# Patient Record
Sex: Male | Born: 1950 | Race: White | Hispanic: No | Marital: Married | Smoking: Former smoker
Health system: Southern US, Community
[De-identification: ages and names within clinical notes are randomized; demographics above are authoritative.]

## PROBLEM LIST (undated history)

## (undated) DIAGNOSIS — E119 Type 2 diabetes mellitus without complications: Secondary | ICD-10-CM

## (undated) DIAGNOSIS — M1990 Unspecified osteoarthritis, unspecified site: Secondary | ICD-10-CM

## (undated) DIAGNOSIS — I10 Essential (primary) hypertension: Secondary | ICD-10-CM

## (undated) DIAGNOSIS — N400 Enlarged prostate without lower urinary tract symptoms: Secondary | ICD-10-CM

## (undated) DIAGNOSIS — N189 Chronic kidney disease, unspecified: Secondary | ICD-10-CM

## (undated) DIAGNOSIS — I6529 Occlusion and stenosis of unspecified carotid artery: Secondary | ICD-10-CM

## (undated) HISTORY — PX: CHOLECYSTECTOMY: SHX55

## (undated) HISTORY — PX: PROSTATE SURGERY: SHX751

## (undated) HISTORY — PX: EYE SURGERY: SHX253

## (undated) HISTORY — PX: FRACTURE SURGERY: SHX138

---

## 2012-04-26 DIAGNOSIS — H35053 Retinal neovascularization, unspecified, bilateral: Secondary | ICD-10-CM

## 2012-04-26 DIAGNOSIS — H30013 Focal chorioretinal inflammation, juxtapapillary, bilateral: Secondary | ICD-10-CM

## 2012-04-26 HISTORY — DX: Focal chorioretinal inflammation, juxtapapillary, bilateral: H30.013

## 2012-04-26 HISTORY — DX: Retinal neovascularization, unspecified, bilateral: H35.053

## 2014-03-16 DIAGNOSIS — M1612 Unilateral primary osteoarthritis, left hip: Secondary | ICD-10-CM

## 2014-03-16 DIAGNOSIS — I10 Essential (primary) hypertension: Secondary | ICD-10-CM

## 2014-03-16 DIAGNOSIS — E785 Hyperlipidemia, unspecified: Secondary | ICD-10-CM

## 2014-03-16 HISTORY — DX: Hyperlipidemia, unspecified: E78.5

## 2014-03-16 HISTORY — DX: Essential (primary) hypertension: I10

## 2014-03-16 HISTORY — DX: Unilateral primary osteoarthritis, left hip: M16.12

## 2015-05-20 DIAGNOSIS — H2511 Age-related nuclear cataract, right eye: Secondary | ICD-10-CM

## 2015-05-20 HISTORY — DX: Age-related nuclear cataract, right eye: H25.11

## 2015-06-25 DIAGNOSIS — E7800 Pure hypercholesterolemia, unspecified: Secondary | ICD-10-CM

## 2015-06-25 HISTORY — DX: Pure hypercholesterolemia, unspecified: E78.00

## 2017-09-27 DIAGNOSIS — M19012 Primary osteoarthritis, left shoulder: Secondary | ICD-10-CM | POA: Insufficient documentation

## 2017-09-27 DIAGNOSIS — M7542 Impingement syndrome of left shoulder: Secondary | ICD-10-CM

## 2017-09-27 HISTORY — DX: Primary osteoarthritis, left shoulder: M19.012

## 2017-09-27 HISTORY — DX: Impingement syndrome of left shoulder: M75.42

## 2019-06-16 DIAGNOSIS — M48061 Spinal stenosis, lumbar region without neurogenic claudication: Secondary | ICD-10-CM

## 2019-06-16 HISTORY — DX: Spinal stenosis, lumbar region without neurogenic claudication: M48.061

## 2019-07-24 DIAGNOSIS — T7840XA Allergy, unspecified, initial encounter: Secondary | ICD-10-CM

## 2019-07-24 HISTORY — DX: Allergy, unspecified, initial encounter: T78.40XA

## 2019-11-06 ENCOUNTER — Other Ambulatory Visit: Payer: Self-pay | Admitting: Neurological Surgery

## 2020-01-09 NOTE — Pre-Procedure Instructions (Signed)
[id number] - [place], [place] - [address] [place] [address] Phone: [phone] Fax: [phone]     Your procedure is scheduled on Wednesday November 10th.  Report to [facility] Main Entrance "A" at 8:00 A.M., and check in at the Admitting office.  Call this number if you have problems the morning of surgery:  [phone]  Call [phone] if you have any questions prior to your surgery date Monday-Friday 8am-4pm    Remember:  Do not eat or drink after midnight the night before your surgery      Take these medicines the morning of surgery with A SIP OF WATER  amLODipine (NORVASC)  fenofibrate micronized (LOFIBRA)   HOW TO MANAGE YOUR DIABETES BEFORE AND AFTER SURGERY  Why is it important to control my blood sugar before and after surgery?  Improving blood sugar levels before and after surgery helps healing and can limit problems.  A way of improving blood sugar control is eating a healthy diet by: o  Eating less sugar and carbohydrates o  Increasing activity/exercise o  Talking with your doctor about reaching your blood sugar goals  High blood sugars (greater than 180 mg/dL) can raise your risk of infections and slow your recovery, so you will need to focus on controlling your diabetes during the weeks before surgery.  Make sure that the doctor who takes care of your diabetes knows about your planned surgery including the date and location.  How do I manage my blood sugar before surgery?  Check your blood sugar at least 4 times a day, starting 2 days before surgery, to make sure that the level is not too high or low. o Check your blood sugar the morning of your surgery when you wake up and every 2 hours until you get to the Short Stay unit.  If your blood sugar is less than 70 mg/dL, you will need to treat for low blood sugar: o Do not take insulin. o Treat a low blood sugar (less than 70 mg/dL) with  cup of clear  juice (cranberry or apple), 4 glucose tablets, OR glucose gel. Recheck blood sugar in 15 minutes after treatment (to make sure it is greater than 70 mg/dL). If your blood sugar is not greater than 70 mg/dL on recheck, call [phone] o  for further instructions.  Report your blood sugar to the short stay nurse when you get to Short Stay.   If you are admitted to the hospital after surgery: o Your blood sugar will be checked by the staff and you will probably be given insulin after surgery (instead of oral diabetes medicines) to make sure you have good blood sugar levels. o The goal for blood sugar control after surgery is 80-180 mg/dL.     WHAT DO I DO ABOUT MY DIABETES MEDICATION?    Do not take oral diabetes medicines (pills): glipiZIDE (GLUCOTROL) the morning of surgery.   THE NIGHT BEFORE SURGERY,do NOT take insulin aspart (NOVOLOG)       THE MORNING OF SURGERY, take 30 units of LEVEMIR FLEXTOUCH insulin.    The day of surgery, do not take other diabetes injectables, including Byetta (exenatide), Bydureon (exenatide ER), Victoza (liraglutide), or Trulicity (dulaglutide).   As of today, STOP taking any Aspirin (unless otherwise instructed by your surgeon) Aleve, Naproxen, Ibuprofen, Motrin, Advil, Goody's, BC's, all herbal medications, fish oil, and all vitamins.  Do not wear jewelry, make up, or nail polish            Do not wear lotions, powders, perfumes/colognes, or deodorant.            Do not shave 48 hours prior to surgery.  Men may shave face and neck.            Do not bring valuables to the hospital.            [facility] is not responsible for any belongings or valuables.  Do NOT Smoke (Tobacco/Vaping) or drink Alcohol 24 hours prior to your procedure If you use a CPAP at night, you may bring all equipment for your overnight stay.   Contacts, glasses, dentures or bridgework may not be worn into surgery.      For patients admitted to the  hospital, discharge time will be determined by your treatment team.   Patients discharged the day of surgery will not be allowed to drive home, and someone needs to stay with them for 24 hours.    Special instructions:   [place]- Preparing For Surgery  Before surgery, you can play an important role. Because skin is not sterile, your skin needs to be as free of germs as possible. You can reduce the number of germs on your skin by washing with CHG (chlorahexidine gluconate) Soap before surgery.  CHG is an antiseptic cleaner which kills germs and bonds with the skin to continue killing germs even after washing.    Oral Hygiene is also important to reduce your risk of infection.  Remember - BRUSH YOUR TEETH THE MORNING OF SURGERY WITH YOUR REGULAR TOOTHPASTE  Please do not use if you have an allergy to CHG or antibacterial soaps. If your skin becomes reddened/irritated stop using the CHG.  Do not shave (including legs and underarms) for at least 48 hours prior to first CHG shower. It is OK to shave your face.  Please follow these instructions carefully.   1. Shower the NIGHT BEFORE SURGERY and the MORNING OF SURGERY with CHG Soap.   2. If you chose to wash your hair, wash your hair first as usual with your normal shampoo.  3. After you shampoo, rinse your hair and body thoroughly to remove the shampoo.  4. Use CHG as you would any other liquid soap. You can apply CHG directly to the skin and wash gently with a scrungie or a clean washcloth.   5. Apply the CHG Soap to your body ONLY FROM THE NECK DOWN.  Do not use on open wounds or open sores. Avoid contact with your eyes, ears, mouth and genitals (private parts). Wash Face and genitals (private parts)  with your normal soap.   6. Wash thoroughly, paying special attention to the area where your surgery will be performed.  7. Thoroughly rinse your body with warm water from the neck down.  8. DO NOT shower/wash with your normal soap  after using and rinsing off the CHG Soap.  9. Pat yourself dry with a CLEAN TOWEL.  10. Wear CLEAN PAJAMAS to bed the night before surgery  11. Place CLEAN SHEETS on your bed the night of your first shower and DO NOT SLEEP WITH PETS.   Day of Surgery: Wear Clean/Comfortable clothing the morning of surgery Do not apply any deodorants/lotions.   Remember to brush your teeth WITH YOUR REGULAR TOOTHPASTE.   Please read over the following fact sheets that you were given.

## 2020-01-12 ENCOUNTER — Other Ambulatory Visit: Payer: Self-pay

## 2020-01-12 ENCOUNTER — Encounter (HOSPITAL_COMMUNITY): Payer: Self-pay

## 2020-01-12 ENCOUNTER — Encounter (HOSPITAL_COMMUNITY)
Admission: RE | Admit: 2020-01-12 | Discharge: 2020-01-12 | Disposition: A | Discharge status: Home / Self Care | Payer: Medicare Other | Source: Ambulatory Visit | Attending: Neurological Surgery | Admitting: Neurological Surgery

## 2020-01-12 ENCOUNTER — Other Ambulatory Visit (HOSPITAL_COMMUNITY)
Admission: RE | Admit: 2020-01-12 | Discharge: 2020-01-12 | Disposition: A | Discharge status: Home / Self Care | Payer: Medicare Other | Source: Ambulatory Visit | Attending: Neurological Surgery | Admitting: Neurological Surgery

## 2020-01-12 ENCOUNTER — Ambulatory Visit (HOSPITAL_COMMUNITY)
Admission: RE | Admit: 2020-01-12 | Discharge: 2020-01-12 | Disposition: A | Discharge status: Home / Self Care | Payer: Medicare Other | Source: Ambulatory Visit | Attending: Neurological Surgery | Admitting: Neurological Surgery

## 2020-01-12 DIAGNOSIS — Z20822 Contact with and (suspected) exposure to covid-19: Secondary | ICD-10-CM | POA: Insufficient documentation

## 2020-01-12 DIAGNOSIS — M48061 Spinal stenosis, lumbar region without neurogenic claudication: Secondary | ICD-10-CM

## 2020-01-12 DIAGNOSIS — Z01818 Encounter for other preprocedural examination: Secondary | ICD-10-CM | POA: Diagnosis present

## 2020-01-12 HISTORY — DX: Essential (primary) hypertension: I10

## 2020-01-12 HISTORY — DX: Unspecified osteoarthritis, unspecified site: M19.90

## 2020-01-12 HISTORY — DX: Benign prostatic hyperplasia without lower urinary tract symptoms: N40.0

## 2020-01-12 HISTORY — DX: Type 2 diabetes mellitus without complications: E11.9

## 2020-01-12 HISTORY — DX: Occlusion and stenosis of unspecified carotid artery: I65.29

## 2020-01-12 HISTORY — DX: Chronic kidney disease, unspecified: N18.9

## 2020-01-12 LAB — PROTIME-INR
INR: 1 (ref 0.8–1.2)
Prothrombin Time: 12.9 seconds (ref 11.4–15.2)

## 2020-01-12 LAB — CBC WITH DIFFERENTIAL/PLATELET
Abs Immature Granulocytes: 0.03 10*3/uL (ref 0.00–0.07)
Basophils Absolute: 0.1 10*3/uL (ref 0.0–0.1)
Basophils Relative: 1 %
Eosinophils Absolute: 0.2 10*3/uL (ref 0.0–0.5)
Eosinophils Relative: 2 %
HCT: 45.4 % (ref 39.0–52.0)
Hemoglobin: 14.2 g/dL (ref 13.0–17.0)
Immature Granulocytes: 0 %
Lymphocytes Relative: 18 %
Lymphs Abs: 1.6 10*3/uL (ref 0.7–4.0)
MCH: 27.7 pg (ref 26.0–34.0)
MCHC: 31.3 g/dL (ref 30.0–36.0)
MCV: 88.7 fL (ref 80.0–100.0)
Monocytes Absolute: 0.6 10*3/uL (ref 0.1–1.0)
Monocytes Relative: 7 %
Neutro Abs: 6.2 10*3/uL (ref 1.7–7.7)
Neutrophils Relative %: 72 %
Platelets: 349 10*3/uL (ref 150–400)
RBC: 5.12 MIL/uL (ref 4.22–5.81)
RDW: 14.8 % (ref 11.5–15.5)
WBC: 8.7 10*3/uL (ref 4.0–10.5)
nRBC: 0 % (ref 0.0–0.2)

## 2020-01-12 LAB — BASIC METABOLIC PANEL
Anion gap: 11 (ref 5–15)
BUN: 16 mg/dL (ref 8–23)
CO2: 22 mmol/L (ref 22–32)
Calcium: 9.2 mg/dL (ref 8.9–10.3)
Chloride: 105 mmol/L (ref 98–111)
Creatinine, Ser: 0.96 mg/dL (ref 0.61–1.24)
GFR, Estimated: >60 mL/min (ref >60)
Glucose, Bld: 127 mg/dL — ABNORMAL HIGH (ref 70–99)
Potassium: 4 mmol/L (ref 3.5–5.1)
Sodium: 138 mmol/L (ref 135–145)

## 2020-01-12 LAB — GLUCOSE, CAPILLARY: Glucose-Capillary: 140 mg/dL — ABNORMAL HIGH (ref 70–99)

## 2020-01-12 LAB — HEMOGLOBIN A1C
Hgb A1c MFr Bld: 8.3 % — ABNORMAL HIGH (ref 4.8–5.6)
Mean Plasma Glucose: 191.51 mg/dL

## 2020-01-12 LAB — SARS CORONAVIRUS 2 (TAT 6-24 HRS): SARS Coronavirus 2: NEGATIVE

## 2020-01-12 LAB — SURGICAL PCR SCREEN
MRSA, PCR: NEGATIVE
Staphylococcus aureus: POSITIVE — AB

## 2020-01-12 NOTE — Progress Notes (Signed)
PCP - Doreen Salvage, PA-C Cardiologist - denies   Chest x-ray - 01/12/20 EKG - recent EKG at PCP- request Stress Test - "in past"- records requested ECHO - denies Cardiac Cath - denies   Sleep Study - denies   Fasting Blood Sugar - 140's Checks Blood Sugar daily  Aspirin Instructions: Patient instructed to hold all Aspirin, NSAID's, herbal medications, fish oil and vitamins 7 days prior to surgery.   COVID TEST- 01/12/20 at at [address] [facility]. Pt instructed to remain in their car. Educated on Haematologist until [employer]    Anesthesia review: yes- records requested  Patient denies shortness of breath, fever, cough and chest pain at PAT appointment   All instructions explained to the patient, with a verbal understanding of the material. Patient agrees to go over the instructions while at home for a better understanding. Patient also instructed to self quarantine after being tested for COVID-19. The opportunity to ask questions was provided.

## 2020-01-13 ENCOUNTER — Encounter (HOSPITAL_COMMUNITY): Payer: Self-pay

## 2020-01-13 NOTE — Progress Notes (Signed)
Anesthesia Chart Review:  Case: [id number] Date/Time: 01/14/20 0945   Procedure: Laminectomy - L3-L4 - L4-L5 - left (Left Back) - 3C   Anesthesia type: General   Pre-op diagnosis: Stenosis   Location: [facility] ROOM 18 / [facility] OR   Surgeons: Tia Alert, MD      DISCUSSION: Patient is a 69 year old [name] scheduled for the above procedure.  History includes former smoker, HTN, DM2, left renal cysts & hepatic steatosis (noted on 04/10/19 abd [place] @ [facility]), BPH (s/p TURP 03/13/18), carotid stenosis (RICA 60-79% 04/2019), + COVID-19 test 10/17/19. BMI is consistent with morbid obesity.  Evaluation by his PCP Doreen Salvage, PA-C on 10/Ronald/21. EKG done as part of presurgical evaluation and was felt stable. He advised improved surgical outcomes with A1c < 7.2%, so patient followed up with his endocrinologist Dr. Katrinka Blazing. A1c 8.5% on 12/23/19 which had dropped from 9.4% since 11/25/19. Dr. Katrinka Blazing wrote (Care Everywhere), "His A1C has continued improve - it is actually better than 8.5%- it has improved on e point in one month.Marland KitchenMarland KitchenHis A1C continues to improve. He can proeed with surgery."  01/12/2020 presurgical COVID-19 test negative.  Anesthesia team to evaluate on the day of surgery.   VS: BP (!) (P) 142/59   Pulse (P) 78   Resp (P) 18   Ht (P) 5\' 4"  (1.626 m)   Wt (P) 105.8 kg   SpO2 (P) 97%   BMI (P) 40.04 kg/m    PROVIDERS: , PA-C is PCP. Last visit 10/Ronald/21 and aware of surgery plans. He notes KEG stable, and patient without chest pain or SOB. He advised that ideally A1c < 7.2% would improve results. Normal ABI on 04/10/19 and follow-up carotid 06/08/19 showed 60-79% RICA [place]. [facility]) - [facility] Katrinka Blazing, MD is endocrinologist [facility] Care Everywhere)   LABS: Labs reviewed: Acceptable for surgery. A1c 8.3% (down from 8.5% 12/23/19 and 9.4% 11/25/19, [facility]) (all labs ordered are listed, but only abnormal results are displayed)  Labs Reviewed  SURGICAL PCR  SCREEN - Abnormal; Notable for the following components:      Result Value   Staphylococcus aureus POSITIVE (*)    All other components within normal limits  GLUCOSE, CAPILLARY - Abnormal; Notable for the following components:   Glucose-Capillary 140 (*)    All other components within normal limits  HEMOGLOBIN A1C - Abnormal; Notable for the following components:   Hgb A1c MFr Bld 8.3 (*)    All other components within normal limits  BASIC METABOLIC PANEL - Abnormal; Notable for the following components:   Glucose, Bld 127 (*)    All other components within normal limits  CBC WITH DIFFERENTIAL/PLATELET  PROTIME-INR     IMAGES: CXR 01/12/20: FINDINGS: Frontal and lateral views of the chest demonstrate an unremarkable cardiac silhouette. No airspace disease, effusion, or pneumothorax. Diffuse thoracic spondylosis. IMPRESSION: 1. No acute intrathoracic process.   EKG: 10/Ronald/21 [facility]): Sinus Rhythm. Non-specific T wave abnormality.    CV: Carotid [facility] 04/10/19 [facility]):  Findings: 2D imaging shows mild to moderate focal heterogeneous atherosclerotic plaque of the right carotid bulb and mild irregular atheroma of the left carotid bulb.  Spectral analysis on the left was normal.  Spectral analysis on the right shows an increase in peak systolic velocity 166 cm/s at the right carotid bulb with ICA/CCA ratio of on the right of 2.25. Conclusion: Abnormal carotid study showing moderate to severe 60-79% right internal carotid artery  stenosis.  Previous stress and echo > 5 years ago [facility] records suggest echo in 2009, stress test in 2012)  Past Medical History:  Diagnosis Date  . Arthritis   . BPH (benign prostatic hyperplasia)   . Carotid artery stenosis    RICA stenosis 60-79% 04/2019 [facility])  . Chronic kidney disease    cyst on L kidney  . Diabetes mellitus without complication (HCC)   . Hypertension     Past Surgical History:   Procedure Laterality Date  . CHOLECYSTECTOMY    . EYE SURGERY    . FRACTURE SURGERY     L arm  . PROSTATE SURGERY      MEDICATIONS: . amLODipine (NORVASC) 10 MG tablet  . atorvastatin (LIPITOR) 40 MG tablet  . EPINEPHrine 0.3 mg/0.3 mL IJ SOAJ injection  . fenofibrate micronized (LOFIBRA) 134 MG capsule  . glipiZIDE (GLUCOTROL) 10 MG tablet  . insulin aspart (NOVOLOG) 100 UNIT/ML FlexPen  . LEVEMIR FLEXTOUCH 100 UNIT/ML FlexPen  . losartan (COZAAR) 100 MG tablet  . metFORMIN (GLUCOPHAGE) 500 MG tablet  . Multiple Vitamins-Minerals (PRESERVISION AREDS PO)   No current facility-administered medications for this encounter.    Shonna Chock, PA-C Surgical Short Stay/Anesthesiology [facility] Phone [phone] [facility] Phone [phone] 01/13/2020 10:00 AM

## 2020-01-13 NOTE — Anesthesia Preprocedure Evaluation (Addendum)
Anesthesia Evaluation  Patient identified by MRN, date of birth, ID band Patient awake    Reviewed: Allergy & Precautions, NPO status , Patient's Chart, lab work & pertinent test results  Airway Mallampati: II  TM Distance: >3 FB Neck ROM: Full    Dental  (+) Edentulous Lower, Edentulous Upper   Pulmonary former smoker,    Pulmonary exam normal breath sounds clear to auscultation       Cardiovascular hypertension, Pt. on medications Normal cardiovascular exam Rhythm:Regular Rate:Normal     Neuro/Psych negative neurological ROS  negative psych ROS   GI/Hepatic negative GI ROS, Neg liver ROS,   Endo/Other  diabetes, Insulin Dependent, Oral Hypoglycemic AgentsMorbid obesity  Renal/GU Renal disease     Musculoskeletal negative musculoskeletal ROS (+)   Abdominal   Peds  Hematology HLD   Anesthesia Other Findings Stenosis  Reproductive/Obstetrics                            Anesthesia Physical Anesthesia Plan  ASA: III  Anesthesia Plan: General   Post-op Pain Management:    Induction: Intravenous  PONV Risk Score and Plan: 2 and Ondansetron, Dexamethasone, Midazolam and Treatment may vary due to age or medical condition  Airway Management Planned: Oral ETT  Additional Equipment:   Intra-op Plan:   Post-operative Plan: Extubation in OR  Informed Consent: I have reviewed the patients History and Physical, chart, labs and discussed the procedure including the risks, benefits and alternatives for the proposed anesthesia with the patient or authorized representative who has indicated his/her understanding and acceptance.     Dental advisory given  Plan Discussed with: CRNA  Anesthesia Plan Comments: (Reviewed PAT note written 01/13/2020 by Shonna Chock, PA-C. )       Anesthesia Quick Evaluation

## 2020-01-14 ENCOUNTER — Encounter (HOSPITAL_COMMUNITY)
Admission: RE | Disposition: A | Discharge status: Home / Self Care | Payer: Self-pay | Source: Home / Self Care | Attending: Neurological Surgery

## 2020-01-14 ENCOUNTER — Encounter (HOSPITAL_COMMUNITY): Payer: Self-pay | Admitting: Neurological Surgery

## 2020-01-14 ENCOUNTER — Other Ambulatory Visit: Payer: Self-pay

## 2020-01-14 ENCOUNTER — Ambulatory Visit (HOSPITAL_COMMUNITY): Payer: Medicare Other | Admitting: Vascular Surgery

## 2020-01-14 ENCOUNTER — Ambulatory Visit (HOSPITAL_COMMUNITY): Payer: Medicare Other | Admitting: Certified Registered"

## 2020-01-14 ENCOUNTER — Ambulatory Visit (HOSPITAL_COMMUNITY): Payer: Medicare Other

## 2020-01-14 ENCOUNTER — Observation Stay (HOSPITAL_COMMUNITY)
Admission: RE | Admit: 2020-01-14 | Discharge: 2020-01-14 | Disposition: A | Discharge status: Home / Self Care | Payer: Medicare Other | Attending: Neurological Surgery | Admitting: Neurological Surgery

## 2020-01-14 DIAGNOSIS — Z79899 Other long term (current) drug therapy: Secondary | ICD-10-CM | POA: Diagnosis not present

## 2020-01-14 DIAGNOSIS — M545 Low back pain, unspecified: Secondary | ICD-10-CM | POA: Diagnosis present

## 2020-01-14 DIAGNOSIS — Z7984 Long term (current) use of oral hypoglycemic drugs: Secondary | ICD-10-CM | POA: Diagnosis not present

## 2020-01-14 DIAGNOSIS — I10 Essential (primary) hypertension: Secondary | ICD-10-CM | POA: Insufficient documentation

## 2020-01-14 DIAGNOSIS — Z794 Long term (current) use of insulin: Secondary | ICD-10-CM | POA: Diagnosis not present

## 2020-01-14 DIAGNOSIS — M48062 Spinal stenosis, lumbar region with neurogenic claudication: Principal | ICD-10-CM | POA: Insufficient documentation

## 2020-01-14 DIAGNOSIS — Z9889 Other specified postprocedural states: Secondary | ICD-10-CM

## 2020-01-14 DIAGNOSIS — E119 Type 2 diabetes mellitus without complications: Secondary | ICD-10-CM | POA: Diagnosis not present

## 2020-01-14 DIAGNOSIS — Z87891 Personal history of nicotine dependence: Secondary | ICD-10-CM | POA: Diagnosis not present

## 2020-01-14 DIAGNOSIS — Z419 Encounter for procedure for purposes other than remedying health state, unspecified: Secondary | ICD-10-CM

## 2020-01-14 HISTORY — PX: LUMBAR LAMINECTOMY/DECOMPRESSION MICRODISCECTOMY: SHX5026

## 2020-01-14 HISTORY — DX: Other specified postprocedural states: Z98.890

## 2020-01-14 LAB — GLUCOSE, CAPILLARY
Glucose-Capillary: 134 mg/dL — ABNORMAL HIGH (ref 70–99)
Glucose-Capillary: 98 mg/dL (ref 70–99)
Glucose-Capillary: 94 mg/dL (ref 70–99)

## 2020-01-14 SURGERY — LUMBAR LAMINECTOMY/DECOMPRESSION MICRODISCECTOMY 2 LEVELS
Anesthesia: General | Site: Back | Laterality: Left

## 2020-01-14 MED ORDER — CHLORHEXIDINE GLUCONATE 0.12 % MT SOLN
15.0000 mL | Freq: Once | OROMUCOSAL | Status: AC
  Administered 2020-01-14: 15 mL via OROMUCOSAL
  Filled 2020-01-14: qty 15

## 2020-01-14 MED ORDER — METHOCARBAMOL 500 MG PO TABS: 500.0000 mg | ORAL_TABLET | Freq: Four times a day (QID) | ORAL | Status: DC | PRN

## 2020-01-14 MED ORDER — POTASSIUM CHLORIDE IN NACL 20-0.9 MEQ/L-% IV SOLN: INTRAVENOUS | Status: DC

## 2020-01-14 MED ORDER — DEXAMETHASONE SODIUM PHOSPHATE 10 MG/ML IJ SOLN
10.0000 mg | Freq: Once | INTRAMUSCULAR | Status: DC
  Filled 2020-01-14: qty 1

## 2020-01-14 MED ORDER — BUPIVACAINE HCL (PF) 0.25 % IJ SOLN
INTRAMUSCULAR | Status: DC | PRN
  Administered 2020-01-14: 7 mL

## 2020-01-14 MED ORDER — SENNA 8.6 MG PO TABS: 1.0000 | ORAL_TABLET | Freq: Two times a day (BID) | ORAL | Status: DC

## 2020-01-14 MED ORDER — FENTANYL CITRATE (PF) 250 MCG/5ML IJ SOLN
INTRAMUSCULAR | Status: AC
  Filled 2020-01-14: qty 5

## 2020-01-14 MED ORDER — ONDANSETRON HCL 4 MG/2ML IJ SOLN: 4.0000 mg | Freq: Once | INTRAMUSCULAR | Status: DC | PRN

## 2020-01-14 MED ORDER — PROPOFOL 10 MG/ML IV BOLUS
INTRAVENOUS | Status: DC | PRN
  Administered 2020-01-14: 125 mg via INTRAVENOUS

## 2020-01-14 MED ORDER — PHENOL 1.4 % MT LIQD: 1.0000 | OROMUCOSAL | Status: DC | PRN

## 2020-01-14 MED ORDER — INSULIN ASPART 100 UNIT/ML ~~LOC~~ SOLN: 0.0000 [IU] | Freq: Three times a day (TID) | SUBCUTANEOUS | Status: DC

## 2020-01-14 MED ORDER — ONDANSETRON HCL 4 MG/2ML IJ SOLN
INTRAMUSCULAR | Status: AC
  Filled 2020-01-14: qty 2

## 2020-01-14 MED ORDER — GLIPIZIDE 10 MG PO TABS
10.0000 mg | ORAL_TABLET | Freq: Two times a day (BID) | ORAL | Status: DC
  Filled 2020-01-14: qty 1

## 2020-01-14 MED ORDER — BUPIVACAINE HCL (PF) 0.25 % IJ SOLN
INTRAMUSCULAR | Status: AC
  Filled 2020-01-14: qty 30

## 2020-01-14 MED ORDER — LIDOCAINE 2% (20 MG/ML) 5 ML SYRINGE
INTRAMUSCULAR | Status: AC
  Filled 2020-01-14: qty 5

## 2020-01-14 MED ORDER — FENOFIBRATE 160 MG PO TABS: 160.0000 mg | ORAL_TABLET | Freq: Every day | ORAL | Status: DC

## 2020-01-14 MED ORDER — ACETAMINOPHEN 650 MG RE SUPP: 650.0000 mg | RECTAL | Status: DC | PRN

## 2020-01-14 MED ORDER — ACETAMINOPHEN 325 MG PO TABS: 650.0000 mg | ORAL_TABLET | ORAL | Status: DC | PRN

## 2020-01-14 MED ORDER — PROPOFOL 10 MG/ML IV BOLUS
INTRAVENOUS | Status: AC
  Filled 2020-01-14: qty 20

## 2020-01-14 MED ORDER — THROMBIN 5000 UNITS EX SOLR
CUTANEOUS | Status: AC
  Filled 2020-01-14: qty 10000

## 2020-01-14 MED ORDER — METHOCARBAMOL 500 MG PO TABS: 500.0000 mg | ORAL_TABLET | Freq: Four times a day (QID) | ORAL | 0 refills | Status: DC | PRN

## 2020-01-14 MED ORDER — HYDROCODONE-ACETAMINOPHEN 5-325 MG PO TABS: 1.0000 | ORAL_TABLET | ORAL | 0 refills | Status: AC | PRN

## 2020-01-14 MED ORDER — AMLODIPINE BESYLATE 10 MG PO TABS: 10.0000 mg | ORAL_TABLET | Freq: Every day | ORAL | Status: DC

## 2020-01-14 MED ORDER — HYDROCODONE-ACETAMINOPHEN 7.5-325 MG PO TABS: 1.0000 | ORAL_TABLET | Freq: Four times a day (QID) | ORAL | Status: DC

## 2020-01-14 MED ORDER — SODIUM CHLORIDE 0.9% FLUSH: 3.0000 mL | INTRAVENOUS | Status: DC | PRN

## 2020-01-14 MED ORDER — ONDANSETRON HCL 4 MG PO TABS: 4.0000 mg | ORAL_TABLET | Freq: Four times a day (QID) | ORAL | Status: DC | PRN

## 2020-01-14 MED ORDER — SODIUM CHLORIDE 0.9 % IV SOLN: 250.0000 mL | INTRAVENOUS | Status: DC

## 2020-01-14 MED ORDER — CEFAZOLIN SODIUM-DEXTROSE 2-4 GM/100ML-% IV SOLN
2.0000 g | INTRAVENOUS | Status: AC
  Administered 2020-01-14: 2 g via INTRAVENOUS
  Filled 2020-01-14: qty 100

## 2020-01-14 MED ORDER — MORPHINE SULFATE (PF) 2 MG/ML IV SOLN: 2.0000 mg | INTRAVENOUS | Status: DC | PRN

## 2020-01-14 MED ORDER — HEMOSTATIC AGENTS (NO CHARGE) OPTIME
TOPICAL | Status: DC | PRN
  Administered 2020-01-14: 1 via TOPICAL

## 2020-01-14 MED ORDER — SODIUM CHLORIDE 0.9% FLUSH: 3.0000 mL | Freq: Two times a day (BID) | INTRAVENOUS | Status: DC

## 2020-01-14 MED ORDER — CHLORHEXIDINE GLUCONATE CLOTH 2 % EX PADS: 6.0000 | MEDICATED_PAD | Freq: Once | CUTANEOUS | Status: DC

## 2020-01-14 MED ORDER — MIDAZOLAM HCL 2 MG/2ML IJ SOLN
INTRAMUSCULAR | Status: AC
  Filled 2020-01-14: qty 2

## 2020-01-14 MED ORDER — ORAL CARE MOUTH RINSE: 15.0000 mL | Freq: Once | OROMUCOSAL | Status: AC

## 2020-01-14 MED ORDER — ONDANSETRON HCL 4 MG/2ML IJ SOLN
INTRAMUSCULAR | Status: DC | PRN
  Administered 2020-01-14: 4 mg via INTRAVENOUS

## 2020-01-14 MED ORDER — SUGAMMADEX SODIUM 200 MG/2ML IV SOLN
INTRAVENOUS | Status: DC | PRN
  Administered 2020-01-14: 200 mg via INTRAVENOUS

## 2020-01-14 MED ORDER — LACTATED RINGERS IV SOLN: INTRAVENOUS | Status: DC

## 2020-01-14 MED ORDER — CEFAZOLIN SODIUM-DEXTROSE 2-4 GM/100ML-% IV SOLN: 2.0000 g | Freq: Three times a day (TID) | INTRAVENOUS | Status: DC

## 2020-01-14 MED ORDER — FENTANYL CITRATE (PF) 250 MCG/5ML IJ SOLN
INTRAMUSCULAR | Status: DC | PRN
Start: 2020-01-14 — End: 2020-01-14
  Administered 2020-01-14: 50 ug via INTRAVENOUS
  Administered 2020-01-14: 150 ug via INTRAVENOUS

## 2020-01-14 MED ORDER — CELECOXIB 200 MG PO CAPS: 200.0000 mg | ORAL_CAPSULE | Freq: Two times a day (BID) | ORAL | Status: DC

## 2020-01-14 MED ORDER — MENTHOL 3 MG MT LOZG: 1.0000 | LOZENGE | OROMUCOSAL | Status: DC | PRN

## 2020-01-14 MED ORDER — PHENYLEPHRINE 40 MCG/ML (10ML) SYRINGE FOR IV PUSH (FOR BLOOD PRESSURE SUPPORT)
PREFILLED_SYRINGE | INTRAVENOUS | Status: DC | PRN
  Administered 2020-01-14 (×3): 80 ug via INTRAVENOUS

## 2020-01-14 MED ORDER — METFORMIN HCL 500 MG PO TABS: 500.0000 mg | ORAL_TABLET | Freq: Every day | ORAL | Status: DC

## 2020-01-14 MED ORDER — DEXAMETHASONE SODIUM PHOSPHATE 10 MG/ML IJ SOLN
INTRAMUSCULAR | Status: AC
  Filled 2020-01-14: qty 1

## 2020-01-14 MED ORDER — ACETAMINOPHEN 10 MG/ML IV SOLN: 1000.0000 mg | Freq: Once | INTRAVENOUS | Status: DC | PRN

## 2020-01-14 MED ORDER — ONDANSETRON HCL 4 MG/2ML IJ SOLN: 4.0000 mg | Freq: Four times a day (QID) | INTRAMUSCULAR | Status: DC | PRN

## 2020-01-14 MED ORDER — ROCURONIUM BROMIDE 10 MG/ML (PF) SYRINGE
PREFILLED_SYRINGE | INTRAVENOUS | Status: AC
  Filled 2020-01-14: qty 10

## 2020-01-14 MED ORDER — ROCURONIUM BROMIDE 10 MG/ML (PF) SYRINGE
PREFILLED_SYRINGE | INTRAVENOUS | Status: DC | PRN
  Administered 2020-01-14: 60 mg via INTRAVENOUS

## 2020-01-14 MED ORDER — LOSARTAN POTASSIUM 50 MG PO TABS: 100.0000 mg | ORAL_TABLET | Freq: Every day | ORAL | Status: DC

## 2020-01-14 MED ORDER — 0.9 % SODIUM CHLORIDE (POUR BTL) OPTIME
TOPICAL | Status: DC | PRN
  Administered 2020-01-14: 1000 mL

## 2020-01-14 MED ORDER — FENTANYL CITRATE (PF) 100 MCG/2ML IJ SOLN
25.0000 ug | INTRAMUSCULAR | Status: DC | PRN
  Administered 2020-01-14 (×2): 25 ug via INTRAVENOUS
  Filled 2020-01-14: qty 2

## 2020-01-14 MED ORDER — METHOCARBAMOL 1000 MG/10ML IJ SOLN
500.0000 mg | Freq: Four times a day (QID) | INTRAVENOUS | Status: DC | PRN
  Filled 2020-01-14: qty 5

## 2020-01-14 MED ORDER — PHENYLEPHRINE HCL-NACL 10-0.9 MG/250ML-% IV SOLN
INTRAVENOUS | Status: DC | PRN
  Administered 2020-01-14: 25 ug/min via INTRAVENOUS

## 2020-01-14 SURGICAL SUPPLY — 50 items
BAND RUBBER #18 3X1/16 STRL (MISCELLANEOUS) ×6 IMPLANT
BENZOIN TINCTURE PRP APPL 2/3 (GAUZE/BANDAGES/DRESSINGS) ×3 IMPLANT
BUR CARBIDE MATCH 3.0 (BURR) ×3 IMPLANT
CANISTER SUCT 3000ML PPV (MISCELLANEOUS) ×3 IMPLANT
CLOSURE WOUND 1/2 X4 (GAUZE/BANDAGES/DRESSINGS) ×1
COVER WAND RF STERILE (DRAPES) ×3 IMPLANT
DERMABOND ADVANCED (GAUZE/BANDAGES/DRESSINGS) ×2
DERMABOND ADVANCED .7 DNX12 (GAUZE/BANDAGES/DRESSINGS) ×1 IMPLANT
DIFFUSER DRILL AIR PNEUMATIC (MISCELLANEOUS) ×3 IMPLANT
DRAPE LAPAROTOMY 100X72X124 (DRAPES) ×3 IMPLANT
DRAPE MICROSCOPE LEICA (MISCELLANEOUS) ×3 IMPLANT
DRAPE SURG 17X23 STRL (DRAPES) ×3 IMPLANT
DRSG OPSITE POSTOP 4X6 (GAUZE/BANDAGES/DRESSINGS) ×3 IMPLANT
DURAPREP 26ML APPLICATOR (WOUND CARE) ×3 IMPLANT
ELECT REM PT RETURN 9FT ADLT (ELECTROSURGICAL) ×3
ELECTRODE REM PT RTRN 9FT ADLT (ELECTROSURGICAL) ×1 IMPLANT
FLOSEAL 10ML (HEMOSTASIS) ×3 IMPLANT
GAUZE 4X4 16PLY RFD (DISPOSABLE) IMPLANT
GLOVE BIO SURGEON STRL SZ 6.5 (GLOVE) ×2 IMPLANT
GLOVE BIO SURGEON STRL SZ7 (GLOVE) ×3 IMPLANT
GLOVE BIO SURGEON STRL SZ8 (GLOVE) ×3 IMPLANT
GLOVE BIO SURGEONS STRL SZ 6.5 (GLOVE) ×1
GLOVE BIOGEL PI IND STRL 7.0 (GLOVE) ×1 IMPLANT
GLOVE BIOGEL PI IND STRL 7.5 (GLOVE) ×3 IMPLANT
GLOVE BIOGEL PI INDICATOR 7.0 (GLOVE) ×2
GLOVE BIOGEL PI INDICATOR 7.5 (GLOVE) ×6
GLOVE SURG SS PI 6.5 STRL IVOR (GLOVE) ×3 IMPLANT
GLOVE SURG SS PI 7.0 STRL IVOR (GLOVE) ×6 IMPLANT
GOWN STRL REUS W/ TWL LRG LVL3 (GOWN DISPOSABLE) ×1 IMPLANT
GOWN STRL REUS W/ TWL XL LVL3 (GOWN DISPOSABLE) ×2 IMPLANT
GOWN STRL REUS W/TWL 2XL LVL3 (GOWN DISPOSABLE) IMPLANT
GOWN STRL REUS W/TWL LRG LVL3 (GOWN DISPOSABLE) ×2
GOWN STRL REUS W/TWL XL LVL3 (GOWN DISPOSABLE) ×4
HEMOSTAT POWDER KIT SURGIFOAM (HEMOSTASIS) IMPLANT
KIT BASIN OR (CUSTOM PROCEDURE TRAY) ×3 IMPLANT
KIT TURNOVER KIT B (KITS) ×3 IMPLANT
NEEDLE HYPO 25X1 1.5 SAFETY (NEEDLE) ×3 IMPLANT
NEEDLE SPNL 20GX3.5 QUINCKE YW (NEEDLE) IMPLANT
NS IRRIG 1000ML POUR BTL (IV SOLUTION) ×3 IMPLANT
PACK LAMINECTOMY NEURO (CUSTOM PROCEDURE TRAY) ×3 IMPLANT
PAD ARMBOARD 7.5X6 YLW CONV (MISCELLANEOUS) IMPLANT
SPONGE SURGIFOAM ABS GEL SZ50 (HEMOSTASIS) IMPLANT
STRIP CLOSURE SKIN 1/2X4 (GAUZE/BANDAGES/DRESSINGS) ×2 IMPLANT
SUT VIC AB 0 CT1 18XCR BRD8 (SUTURE) ×1 IMPLANT
SUT VIC AB 0 CT1 8-18 (SUTURE) ×2
SUT VIC AB 2-0 CP2 18 (SUTURE) ×3 IMPLANT
SUT VIC AB 3-0 SH 8-18 (SUTURE) ×3 IMPLANT
TOWEL GREEN STERILE (TOWEL DISPOSABLE) ×3 IMPLANT
TOWEL GREEN STERILE FF (TOWEL DISPOSABLE) ×3 IMPLANT
WATER STERILE IRR 1000ML POUR (IV SOLUTION) ×3 IMPLANT

## 2020-01-14 NOTE — Plan of Care (Signed)
Patient alert and oriented, mae's well, voiding adequate amount of urine, swallowing without difficulty, no c/o pain at time of discharge. Patient discharged home with family. Script and discharged instructions given to patient. Patient and family stated understanding of instructions given. Patient has an appointment with Dr. Jones °

## 2020-01-14 NOTE — Discharge Summary (Signed)
Physician Discharge Summary  Patient ID: Ronald [name] MRN: [id number] DOB/AGE: November 04, 1950 68 y.o.  Admit date: 01/14/2020 Discharge date: 01/14/2020  Admission Diagnoses: lumbar stenosis    Discharge Diagnoses: same   Discharged Condition: good  Hospital Course: The patient was admitted on 01/14/2020 and taken to the operating room where the patient underwent DLL for stenosis L3-4 L4-5. The patient tolerated the procedure well and was taken to the recovery room and then to the floor in stable condition. The hospital course was routine. There were no complications. The wound remained clean dry and intact. Pt had appropriate back soreness. No complaints of leg pain or new N/T/W. The patient remained afebrile with stable vital signs, and tolerated a regular diet. The patient continued to increase activities, and pain was well controlled with oral pain medications.   Consults: None  Significant Diagnostic Studies:  Results for orders placed or performed during the hospital encounter of 01/14/20  Glucose, capillary  Result Value Ref Range   Glucose-Capillary 98 70 - 99 mg/dL  Glucose, capillary  Result Value Ref Range   Glucose-Capillary 94 70 - 99 mg/dL  Glucose, capillary  Result Value Ref Range   Glucose-Capillary 134 (H) 70 - 99 mg/dL   Comment 1 Notify RN     Chest 2 View  Result Date: 01/12/2020 CLINICAL DATA:  Preoperative assessment for back surgery EXAM: CHEST - 2 VIEW COMPARISON:  07/24/2019 FINDINGS: Frontal and lateral views of the chest demonstrate an unremarkable cardiac silhouette. No airspace disease, effusion, or pneumothorax. Diffuse thoracic spondylosis. IMPRESSION: 1. No acute intrathoracic process. Electronically Signed   By: Sharlet Salina M.D.   On: 01/12/2020 20:55   DG Lumbar Spine 1 View  Result Date: 01/14/2020 CLINICAL DATA:  69 year old film undergoing L3-L5 laminectomy EXAM: LUMBAR SPINE - 1 VIEW COMPARISON:  Prior lumbar spine radiographs  11/04/2019 FINDINGS: Soft tissue spreaders present posterior to L3-L4. A metallic probe is present at the L3-L4 facet joint. IMPRESSION: In progress L3-L5 laminectomy. Electronically Signed   By: Malachy Moan M.D.   On: 01/14/2020 13:26    Antibiotics:  Anti-infectives (From admission, onward)   Start     Dose/Rate Route Frequency Ordered Stop   01/14/20 1915  ceFAZolin (ANCEF) IVPB 2g/100 mL premix        2 g 200 mL/hr over 30 Minutes Intravenous Every 8 hours 01/14/20 1451 01/15/20 1114   01/14/20 0800  ceFAZolin (ANCEF) IVPB 2g/100 mL premix        2 g 200 mL/hr over 30 Minutes Intravenous On call to O.R. 01/14/20 0751 01/14/20 1140      Discharge Exam: Blood pressure (!) 155/63, pulse 80, temperature 97.7 F (36.5 C), temperature source Oral, resp. rate 18, height 5\' 4"  (1.626 m), weight 105.9 kg, SpO2 96 %. Neurologic: Grossly normal dressing dry  Discharge Medications:   Allergies as of 01/14/2020      Reactions   Lidocaine Hives, Itching      Medication List    TAKE these medications   amLODipine 10 MG tablet Commonly known as: NORVASC Take 10 mg by mouth daily.   atorvastatin 40 MG tablet Commonly known as: LIPITOR Take 40 mg by mouth at bedtime.   EPINEPHrine 0.3 mg/0.3 mL Soaj injection Commonly known as: EPI-PEN Inject 0.3 mg into the muscle as needed for anaphylaxis.   fenofibrate micronized 134 MG capsule Commonly known as: LOFIBRA Take 134 mg by mouth daily.   glipiZIDE 10 MG tablet Commonly known as: GLUCOTROL Take 10  mg by mouth 2 (two) times daily.   HYDROcodone-acetaminophen 5-325 MG tablet Commonly known as: NORCO/VICODIN Take 1 tablet by mouth every 4 (four) hours as needed for moderate pain.   insulin aspart 100 UNIT/ML FlexPen Commonly known as: NOVOLOG Inject 5-6 Units into the skin daily as needed for high blood sugar.   Levemir FlexTouch 100 UNIT/ML FlexPen Generic drug: insulin detemir Inject 60 Units into the skin in the  morning.   losartan 100 MG tablet Commonly known as: COZAAR Take 100 mg by mouth daily.   metFORMIN 500 MG tablet Commonly known as: GLUCOPHAGE Take 500 mg by mouth daily.   methocarbamol 500 MG tablet Commonly known as: ROBAXIN Take 1 tablet (500 mg total) by mouth every 6 (six) hours as needed for muscle spasms.   PRESERVISION AREDS PO Take 2 tablets by mouth daily.       Disposition: home   Final Dx: LL for stenosis  Discharge Instructions     Remove dressing in 72 hours   Complete by: As directed    Call MD for:  difficulty breathing, headache or visual disturbances   Complete by: As directed    Call MD for:  hives   Complete by: As directed    Call MD for:  persistant dizziness or light-headedness   Complete by: As directed    Call MD for:  persistant nausea and vomiting   Complete by: As directed    Call MD for:  redness, tenderness, or signs of infection (pain, swelling, redness, odor or green/yellow discharge around incision site)   Complete by: As directed    Call MD for:  severe uncontrolled pain   Complete by: As directed    Call MD for:  temperature >100.4   Complete by: As directed    Diet - low sodium heart healthy   Complete by: As directed    Driving Restrictions   Complete by: As directed    No driving for 2 weeks, no riding in the car for 1 week   Increase activity slowly   Complete by: As directed    Lifting restrictions   Complete by: As directed    No lifting more than 8 lbs         Signed: Tia Alert 01/14/2020, 3:23 PM

## 2020-01-14 NOTE — Anesthesia Procedure Notes (Addendum)
Procedure Name: Intubation Date/Time: 01/14/2020 11:05 AM Performed by: Babs Bertin, CRNA Pre-anesthesia Checklist: Patient identified, Emergency Drugs available, Suction available, Patient being monitored and Timeout performed Patient Re-evaluated:Patient Re-evaluated prior to induction Oxygen Delivery Method: Circle system utilized Preoxygenation: Pre-oxygenation with 100% oxygen Induction Type: IV induction Ventilation: Mask ventilation without difficulty and Oral airway inserted - appropriate to patient size Laryngoscope Size: Mac and 3 Grade View: Grade I Tube type: Oral Tube size: 7.5 mm Number of attempts: 1 Airway Equipment and Method: Stylet Placement Confirmation: breath sounds checked- equal and bilateral,  ETT inserted through vocal cords under direct vision and positive ETCO2 Secured at: 22 cm Tube secured with: Tape Dental Injury: Teeth and Oropharynx as per pre-operative assessment

## 2020-01-14 NOTE — Anesthesia Postprocedure Evaluation (Signed)
Anesthesia Post Note  Patient: Ronald [name]  Procedure(s) Performed: Laminectomy - Lumbar three-Lumbar four - Lumbar four-Lumbar five - left (Left Back)     Patient location during evaluation: PACU Anesthesia Type: General Level of consciousness: awake Pain management: pain level controlled Vital Signs Assessment: post-procedure vital signs reviewed and stable Respiratory status: spontaneous breathing, nonlabored ventilation, respiratory function stable and patient connected to nasal cannula oxygen Cardiovascular status: blood pressure returned to baseline and stable Postop Assessment: no apparent nausea or vomiting Anesthetic complications: no   No complications documented.  Last Vitals:  Vitals:   01/14/20 1409 01/14/20 1457  BP: (!) 146/64 (!) 155/63  Pulse: 81 80  Resp: 16 18  Temp: 36.7 C 36.5 C  SpO2: 96%     Last Pain:  Vitals:   01/14/20 1457  TempSrc: Oral  PainSc:                  Porshea Janowski P Mailee Klaas

## 2020-01-14 NOTE — Op Note (Signed)
01/14/2020  12:44 PM  PATIENT:  Ronald [name]  69 y.o. 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 Maisie Fus  ANESTHESIA:   General  EBL: 25 ml  Total I/O In: 100 [IV Piggyback:100] Out: -   BLOOD ADMINISTERED: none  DRAINS: None  SPECIMEN:  none  INDICATION FOR PROCEDURE: This patient presented with lateral leg pain with ambulation. Imaging showed final stenosis L3-4 and L4-5. The patient tried conservative measures without relief. Pain was debilitating. Recommended decompressive hemilaminectomy L3-4 and L4-5 followed by sublaminar decompression. Patient understood the risks, benefits, and alternatives and potential outcomes and wished to proceed.  PROCEDURE DETAILS: The patient was taken to the operating room and after induction of adequate generalized endotracheal anesthesia, the patient was rolled into the prone position on the Wilson frame and all pressure points were padded. The lumbar region was cleaned and then prepped with DuraPrep and draped in the usual sterile fashion. 5 cc of local anesthesia was injected and then a dorsal midline incision was made and carried down to the lumbo sacral fascia. The fascia was opened and the paraspinous musculature was taken down in a subperiosteal fashion to expose L3 4 and L4-5 on the left. Intraoperative x-ray confirmed my level, and then I used a combination of the high-speed drill and the Kerrison punches to perform a hemilaminectomy, medial facetectomy, and foraminotomy at L3-4 and L4-5 on the left. The underlying yellow ligament was opened and removed in a piecemeal fashion to expose the underlying dura and  exiting nerve root. I undercut the lateral recess and dissected down until I was medial to and distal to the pedicle. The nerve root was well decompressed.  I drilled up under the spinous process at both levels and then angled the retractor medially and performed a sublaminar decompression and decompress the right lateral recess from the left side. I then palpated with a coronary dilator along the nerve root and into the foramen to assure adequate decompression. I felt no more compression of the nerve root. I irrigated with saline solution containing bacitracin. Achieved hemostasis with bipolar cautery, lined the dura with Gelfoam, and then closed the fascia with 0 Vicryl. I closed the subcutaneous tissues with 2-0 Vicryl and the subcuticular tissues with 3-0 Vicryl. The skin was then closed with benzoin and Steri-Strips. The drapes were removed, a sterile dressing was applied.  My nurse practitioner and Dr. Maisie Fus were involved in the exposure, safe retraction of the neural elements, the disc work and the closure. the patient was awakened from general anesthesia and transferred to the recovery room in stable condition. At the end of the procedure all sponge, needle and instrument counts were correct.    PLAN OF CARE: Admit for overnight observation  PATIENT DISPOSITION:  PACU - hemodynamically stable.   Delay start of Pharmacological VTE agent (>24hrs) due to surgical blood loss or risk of bleeding:  yes

## 2020-01-14 NOTE — H&P (Signed)
Subjective: Patient is a 69 y.o. male admitted for stenossi. Onset of symptoms was several months ago, gradually worsening since that time.  The pain is rated moderate, and is located at the across the lower back and radiates to BLE. The pain is described as aching and occurs intermittently. The symptoms have been progressive. Symptoms are exacerbated by exercise. MRI or CT showed stenosis   Past Medical History:  Diagnosis Date  . Arthritis   . BPH (benign prostatic hyperplasia)   . Carotid artery stenosis    RICA stenosis 60-79% 04/2019 [facility])  . Chronic kidney disease    cyst on L kidney  . Diabetes mellitus without complication (HCC)   . Hypertension     Past Surgical History:  Procedure Laterality Date  . CHOLECYSTECTOMY    . EYE SURGERY    . FRACTURE SURGERY     L arm  . PROSTATE SURGERY     s/p TURP 03/13/18    Prior to Admission medications   Medication Sig Start Date End Date Taking? Authorizing Provider  amLODipine (NORVASC) 10 MG tablet Take 10 mg by mouth daily. 12/24/19  Yes [provider]  atorvastatin (LIPITOR) 40 MG tablet Take 40 mg by mouth at bedtime. 11/13/19  Yes [provider]  EPINEPHrine 0.3 mg/0.3 mL IJ SOAJ injection Inject 0.3 mg into the muscle as needed for anaphylaxis.  07/24/19  Yes [provider]  fenofibrate micronized (LOFIBRA) 134 MG capsule Take 134 mg by mouth daily. 09/01/19  Yes [provider]  glipiZIDE (GLUCOTROL) 10 MG tablet Take 10 mg by mouth 2 (two) times daily. 12/17/19  Yes [provider]  insulin aspart (NOVOLOG) 100 UNIT/ML FlexPen Inject 5-6 Units into the skin daily as needed for high blood sugar.  10/15/19  Yes [provider]  LEVEMIR FLEXTOUCH 100 UNIT/ML FlexPen Inject 60 Units into the skin in the morning. 12/10/19  Yes [provider]  losartan (COZAAR) 100 MG tablet Take 100 mg by mouth daily. 10/11/19  Yes [provider]  metFORMIN  (GLUCOPHAGE) 500 MG tablet Take 500 mg by mouth daily. 12/20/18  Yes [provider]  Multiple Vitamins-Minerals (PRESERVISION AREDS PO) Take 2 tablets by mouth daily.   Yes [provider]   Allergies  Allergen Reactions  . Lidocaine Hives and Itching    Social History   Tobacco Use  . Smoking status: Former Games developer  . Smokeless tobacco: Former Engineer, water Use Topics  . Alcohol use: Never    History reviewed. No pertinent family history.   Review of Systems  Positive ROS: neg  All other systems have been reviewed and were otherwise negative with the exception of those mentioned in the HPI and as above.  Objective: Vital signs in last 24 hours: Temp:  [98.3 F (36.8 C)] 98.3 F (36.8 C) (11/10 0750) Pulse Rate:  [87] 87 (11/10 0750) Resp:  [18] 18 (11/10 0750) BP: (193)/(61) 193/61 (11/10 0750) SpO2:  [95 %] 95 % (11/10 0750) Weight:  [105.9 kg] 105.9 kg (11/10 0750)  General Appearance: Alert, cooperative, no distress, appears stated age Head: Normocephalic, without obvious abnormality, atraumatic Eyes: PERRL, conjunctiva/corneas clear, EOM's intact    Neck: Supple, symmetrical, trachea midline Back: Symmetric, no curvature, ROM normal, no CVA tenderness Lungs:  respirations unlabored Heart: Regular rate and rhythm Abdomen: Soft, non-tender Extremities: Extremities normal, atraumatic, no cyanosis or edema Pulses: 2+ and symmetric all extremities Skin: Skin color, texture, turgor normal, no rashes or lesions  NEUROLOGIC:   Mental status: Alert and oriented x4,  no aphasia, good attention span, fund of knowledge, and memory Motor Exam - grossly normal Sensory Exam - grossly normal Reflexes: trace Coordination - grossly normal Gait - grossly normal Balance - grossly normal Cranial Nerves: I: smell Not tested  II: visual acuity  OS: nl    OD: nl  II: visual fields Full to confrontation  II: pupils Equal, round, reactive to light  III,VII:  ptosis None  III,IV,VI: extraocular muscles  Full ROM  V: mastication Normal  V: facial light touch sensation  Normal  V,VII: corneal reflex  Present  VII: facial muscle function - upper  Normal  VII: facial muscle function - lower Normal  VIII: hearing Not tested  IX: soft palate elevation  Normal  IX,X: gag reflex Present  XI: trapezius strength  5/5  XI: sternocleidomastoid strength 5/5  XI: neck flexion strength  5/5  XII: tongue strength  Normal    Data Review Lab Results  Component Value Date   WBC 8.7 01/12/2020   HGB 14.2 01/12/2020   HCT 45.4 01/12/2020   MCV 88.7 01/12/2020   PLT 349 01/12/2020   Lab Results  Component Value Date   NA 138 01/12/2020   K 4.0 01/12/2020   CL 105 01/12/2020   CO2 22 01/12/2020   BUN 16 01/12/2020   CREATININE 0.96 01/12/2020   GLUCOSE 127 (H) 01/12/2020   Lab Results  Component Value Date   INR 1.0 01/12/2020    Assessment/Plan:  Estimated body mass index is 40.06 kg/m as calculated from the following:   Height as of this encounter: 5\' 4"  (1.626 m).   Weight as of this encounter: 105.9 kg. Patient admitted for DLL L3-4 L4-5. Patient has failed a reasonable attempt at conservative therapy.  I explained the condition and procedure to the patient and answered any questions.  Patient wishes to proceed with procedure as planned. Understands risks/ benefits and typical outcomes of procedure.   01/14/2020 10:30 AM

## 2020-01-14 NOTE — Progress Notes (Signed)
Wasted 1 cc fentanyl with witness Administrator, arts

## 2020-01-14 NOTE — Transfer of Care (Signed)
Immediate Anesthesia Transfer of Care Note  Patient: Ronald [name]  Procedure(s) Performed: Laminectomy - Lumbar three-Lumbar four - Lumbar four-Lumbar five - left (Left Back)  Patient Location: PACU  Anesthesia Type:General  Level of Consciousness: awake, alert  and oriented  Airway & Oxygen Therapy: Patient Spontanous Breathing and Patient connected to face mask oxygen  Post-op Assessment: Report given to RN and Post -op Vital signs reviewed and stable  Post vital signs: Reviewed and stable  Last Vitals:  Vitals Value Taken Time  BP    Temp    Pulse 79 01/14/20 1251  Resp    SpO2 96 % 01/14/20 1251  Vitals shown include unvalidated device data.  Last Pain:  Vitals:   01/14/20 0810  TempSrc:   PainSc: 0-No pain         Complications: No complications documented.

## 2020-01-15 ENCOUNTER — Encounter (HOSPITAL_COMMUNITY): Payer: Self-pay | Admitting: Neurological Surgery

## 2020-06-11 ENCOUNTER — Other Ambulatory Visit: Payer: Self-pay | Admitting: Neurological Surgery

## 2020-06-11 DIAGNOSIS — M4316 Spondylolisthesis, lumbar region: Secondary | ICD-10-CM

## 2020-06-29 ENCOUNTER — Ambulatory Visit
Admission: RE | Admit: 2020-06-29 | Discharge: 2020-06-29 | Disposition: A | Discharge status: Home / Self Care | Payer: Medicare Other | Source: Ambulatory Visit | Attending: Neurological Surgery | Admitting: Neurological Surgery

## 2020-06-29 DIAGNOSIS — M4316 Spondylolisthesis, lumbar region: Secondary | ICD-10-CM

## 2020-06-29 MED ORDER — GADOBENATE DIMEGLUMINE 529 MG/ML IV SOLN
20.0000 mL | Freq: Once | INTRAVENOUS | Status: AC | PRN
  Administered 2020-06-29: 20 mL via INTRAVENOUS

## 2020-06-30 ENCOUNTER — Other Ambulatory Visit: Payer: Medicare Other

## 2021-05-31 ENCOUNTER — Other Ambulatory Visit: Payer: Self-pay

## 2021-05-31 ENCOUNTER — Emergency Department (HOSPITAL_COMMUNITY)
Admission: EM | Admit: 2021-05-31 | Discharge: 2021-05-31 | Disposition: A | Discharge status: Home / Self Care | Payer: Medicare Other | Attending: Emergency Medicine | Admitting: Emergency Medicine

## 2021-05-31 ENCOUNTER — Emergency Department (HOSPITAL_COMMUNITY): Payer: Medicare Other

## 2021-05-31 DIAGNOSIS — Z7984 Long term (current) use of oral hypoglycemic drugs: Secondary | ICD-10-CM | POA: Diagnosis not present

## 2021-05-31 DIAGNOSIS — I129 Hypertensive chronic kidney disease with stage 1 through stage 4 chronic kidney disease, or unspecified chronic kidney disease: Secondary | ICD-10-CM | POA: Diagnosis not present

## 2021-05-31 DIAGNOSIS — T7840XA Allergy, unspecified, initial encounter: Secondary | ICD-10-CM | POA: Diagnosis not present

## 2021-05-31 DIAGNOSIS — N189 Chronic kidney disease, unspecified: Secondary | ICD-10-CM | POA: Insufficient documentation

## 2021-05-31 DIAGNOSIS — Z79899 Other long term (current) drug therapy: Secondary | ICD-10-CM | POA: Insufficient documentation

## 2021-05-31 DIAGNOSIS — E1122 Type 2 diabetes mellitus with diabetic chronic kidney disease: Secondary | ICD-10-CM | POA: Insufficient documentation

## 2021-05-31 DIAGNOSIS — Z794 Long term (current) use of insulin: Secondary | ICD-10-CM | POA: Diagnosis not present

## 2021-05-31 DIAGNOSIS — R42 Dizziness and giddiness: Secondary | ICD-10-CM | POA: Diagnosis present

## 2021-05-31 LAB — CBC WITH DIFFERENTIAL/PLATELET
Abs Immature Granulocytes: 0.06 10*3/uL (ref 0.00–0.07)
Basophils Absolute: 0.1 10*3/uL (ref 0.0–0.1)
Basophils Relative: 1 %
Eosinophils Absolute: 0.3 10*3/uL (ref 0.0–0.5)
Eosinophils Relative: 2 %
HCT: 45.9 % (ref 39.0–52.0)
Hemoglobin: 15.2 g/dL (ref 13.0–17.0)
Immature Granulocytes: 0 %
Lymphocytes Relative: 20 %
Lymphs Abs: 3 10*3/uL (ref 0.7–4.0)
MCH: 29.1 pg (ref 26.0–34.0)
MCHC: 33.1 g/dL (ref 30.0–36.0)
MCV: 87.9 fL (ref 80.0–100.0)
Monocytes Absolute: 1.2 10*3/uL — ABNORMAL HIGH (ref 0.1–1.0)
Monocytes Relative: 8 %
Neutro Abs: 10.3 10*3/uL — ABNORMAL HIGH (ref 1.7–7.7)
Neutrophils Relative %: 69 %
Platelets: 463 10*3/uL — ABNORMAL HIGH (ref 150–400)
RBC: 5.22 MIL/uL (ref 4.22–5.81)
RDW: 13.9 % (ref 11.5–15.5)
WBC: 14.9 10*3/uL — ABNORMAL HIGH (ref 4.0–10.5)
nRBC: 0 % (ref 0.0–0.2)

## 2021-05-31 LAB — COMPREHENSIVE METABOLIC PANEL
ALT: 17 U/L (ref 0–44)
AST: 22 U/L (ref 15–41)
Albumin: 3.4 g/dL — ABNORMAL LOW (ref 3.5–5.0)
Alkaline Phosphatase: 45 U/L (ref 38–126)
Anion gap: 11 (ref 5–15)
BUN: 22 mg/dL (ref 8–23)
CO2: 22 mmol/L (ref 22–32)
Calcium: 9.1 mg/dL (ref 8.9–10.3)
Chloride: 106 mmol/L (ref 98–111)
Creatinine, Ser: 1.57 mg/dL — ABNORMAL HIGH (ref 0.61–1.24)
GFR, Estimated: 46 mL/min — ABNORMAL LOW (ref >60)
Glucose, Bld: 208 mg/dL — ABNORMAL HIGH (ref 70–99)
Potassium: 3.5 mmol/L (ref 3.5–5.1)
Sodium: 139 mmol/L (ref 135–145)
Total Bilirubin: 0.4 mg/dL (ref 0.3–1.2)
Total Protein: 6.2 g/dL — ABNORMAL LOW (ref 6.5–8.1)

## 2021-05-31 MED ORDER — DIPHENHYDRAMINE HCL 25 MG PO TABS: 25.0000 mg | ORAL_TABLET | Freq: Four times a day (QID) | ORAL | 0 refills | Status: DC | PRN

## 2021-05-31 MED ORDER — FAMOTIDINE 20 MG PO TABS: 20.0000 mg | ORAL_TABLET | Freq: Two times a day (BID) | ORAL | 0 refills | Status: DC

## 2021-05-31 MED ORDER — ALBUTEROL SULFATE HFA 108 (90 BASE) MCG/ACT IN AERS: 2.0000 | INHALATION_SPRAY | Freq: Four times a day (QID) | RESPIRATORY_TRACT | Status: DC

## 2021-05-31 MED ORDER — FAMOTIDINE IN NACL 20-0.9 MG/50ML-% IV SOLN
20.0000 mg | Freq: Once | INTRAVENOUS | Status: AC
  Administered 2021-05-31: 20 mg via INTRAVENOUS
  Filled 2021-05-31: qty 50

## 2021-05-31 MED ORDER — SODIUM CHLORIDE 0.9 % IV BOLUS
1000.0000 mL | Freq: Once | INTRAVENOUS | Status: AC
  Administered 2021-05-31: 1000 mL via INTRAVENOUS

## 2021-05-31 MED ORDER — SODIUM CHLORIDE 0.9 % IV SOLN: INTRAVENOUS | Status: DC

## 2021-05-31 MED ORDER — ALBUTEROL SULFATE (2.5 MG/3ML) 0.083% IN NEBU
2.5000 mg | INHALATION_SOLUTION | Freq: Once | RESPIRATORY_TRACT | Status: AC
  Administered 2021-05-31: 2.5 mg via RESPIRATORY_TRACT
  Filled 2021-05-31: qty 3

## 2021-05-31 NOTE — Discharge Instructions (Addendum)
I would recommend continuing Benadryl 25 mg every 6 hours for the next 24 hours.  Take the Pepcid for the next 7 days.  Use the albuterol inhaler just as needed if you feel short of breath.  Return for any new or worse symptoms.  Obviously avoid Kenalog in the future. ? ?The albuterol inhaler should be provided here before you leave. ?

## 2021-05-31 NOTE — ED Triage Notes (Signed)
Pt arrived via [facility] with c/c of allergic reaction. Per EMS pt was getting nerves burnt off back and received kenalog and started having a reaction. Pt complaints of trouble breathing.  ? ?50 benadryl, 1mg  Epi, 350 ML NSS ?72/40,  ?

## 2021-05-31 NOTE — ED Provider Notes (Addendum)
?[place] ?Provider Note ? ? ?CSN: [id number] ?Arrival date & time: 05/31/21  1037 ? ?  ? ?History ? ?Chief Complaint  ?Patient presents with  ? Allergic Reaction  ? ? ?Ronald [name] is a 71 y.o. male. ? ?Patient sent in from [facility] office.  Patient had an allergic reaction to Kenalog that was injected in his back they were doing some burning of nerve roots to help with chronic pain.  Patient's had a previous reaction to Kenalog.  Family thought they expressed that patient could not have that.  Patient is not clear if he can have any steroids.  Patient given Benadryl and given epinephrine and brought in by EMS.  Patient hypotensive at the scene with a systolic pressure of 72.  He received 1 mg of epinephrine and 50 mg of Benadryl and a 350 cc bolus.  Patient's complaint is a having trouble breathing.  And that he is got swelling and kind to has some numbness and he felt lightheadedness.  He originally started with some itching in his ears and then that spread to some other places but no rash.  Itching is now resolved.  He did not complete the procedure. ? ?Past medical history is significant for hypertension diabetes chronic kidney disease benign prostatic hyperplasia.  Past surgical history is significant for cholecystectomy status post TURP in 2020 lumbar laminectomy and decompression and microdiscectomy in 2021. ? ? ?  ? ?Home Medications ?Prior to Admission medications   ?Medication Sig Start Date End Date Taking? Authorizing Provider  ?amLODipine (NORVASC) 10 MG tablet Take 10 mg by mouth daily. 12/24/19   [provider]  ?atorvastatin (LIPITOR) 40 MG tablet Take 40 mg by mouth at bedtime. 11/13/19   [provider]  ?EPINEPHrine 0.3 mg/0.3 mL IJ SOAJ injection Inject 0.3 mg into the muscle as needed for anaphylaxis.  07/24/19   [provider]  ?fenofibrate micronized (LOFIBRA) 134 MG capsule Take 134 mg by mouth daily. 09/01/19    [provider]  ?glipiZIDE (GLUCOTROL) 10 MG tablet Take 10 mg by mouth 2 (two) times daily. 12/17/19   [provider]  ?insulin aspart (NOVOLOG) 100 UNIT/ML FlexPen Inject 5-6 Units into the skin daily as needed for high blood sugar.  10/15/19   [provider]  ?LEVEMIR FLEXTOUCH 100 UNIT/ML FlexPen Inject 60 Units into the skin in the morning. 12/10/19   [provider]  ?losartan (COZAAR) 100 MG tablet Take 100 mg by mouth daily. 10/11/19   [provider]  ?metFORMIN (GLUCOPHAGE) 500 MG tablet Take 500 mg by mouth daily. 12/20/18   [provider]  ?methocarbamol (ROBAXIN) 500 MG tablet Take 1 tablet (500 mg total) by mouth every 6 (six) hours as needed for muscle spasms. 01/14/20   Meyran, Ocie Cornfield, NP  ?Multiple Vitamins-Minerals (PRESERVISION AREDS PO) Take 2 tablets by mouth daily.    [provider]  ?   ? ?Allergies    ?Kenalog [triamcinolone acetonide] and Lidocaine   ? ?Review of Systems   ?Review of Systems  ?Constitutional:  Negative for chills and fever.  ?HENT:  Negative for ear pain, sore throat and trouble swallowing.   ?Eyes:  Negative for pain and visual disturbance.  ?Respiratory:  Positive for shortness of breath. Negative for cough.   ?Cardiovascular:  Negative for chest pain and palpitations.  ?Gastrointestinal:  Negative for abdominal pain and vomiting.  ?Genitourinary:  Negative for dysuria and hematuria.  ?Musculoskeletal:  Negative  for arthralgias and back pain.  ?Skin:  Negative for color change and rash.  ?Neurological:  Positive for light-headedness and numbness. Negative for seizures, syncope, facial asymmetry, speech difficulty, weakness and headaches.  ?All other systems reviewed and are negative. ? ?Physical Exam ?Updated Vital Signs ?BP (!) 110/45   Pulse 80   Temp (!) 97.3 ?F (36.3 ?C) (Oral)   Resp (!) 22   Ht 1.626 m (5\' 4" )   Wt 104.8 kg   SpO2 92%   BMI 39.65 kg/m?  ?Physical Exam ?Vitals and nursing  note reviewed.  ?Constitutional:   ?   General: He is not in acute distress. ?   Appearance: Normal appearance. He is well-developed.  ?HENT:  ?   Head: Normocephalic and atraumatic.  ?   Mouth/Throat:  ?   Mouth: Mucous membranes are moist.  ?   Pharynx: Oropharynx is clear.  ?   Comments: No lip or tongue swelling. ?Eyes:  ?   Extraocular Movements: Extraocular movements intact.  ?   Conjunctiva/sclera: Conjunctivae normal.  ?   Pupils: Pupils are equal, round, and reactive to light.  ?Cardiovascular:  ?   Rate and Rhythm: Normal rate and regular rhythm.  ?   Heart sounds: No murmur heard. ?Pulmonary:  ?   Effort: Pulmonary effort is normal. No respiratory distress.  ?   Breath sounds: Normal breath sounds. No stridor. No wheezing, rhonchi or rales.  ?Abdominal:  ?   Palpations: Abdomen is soft.  ?   Tenderness: There is no abdominal tenderness.  ?Musculoskeletal:     ?   General: No swelling.  ?   Cervical back: Normal range of motion and neck supple. No rigidity.  ?Skin: ?   General: Skin is warm and dry.  ?   Capillary Refill: Capillary refill takes less than 2 seconds.  ?   Findings: No erythema or rash.  ?   Comments: No rash or hives.  ?Neurological:  ?   General: No focal deficit present.  ?   Mental Status: He is alert and oriented to person, place, and time.  ?Psychiatric:     ?   Mood and Affect: Mood normal.  ? ? ?ED Results / Procedures / Treatments   ?Labs ?(all labs ordered are listed, but only abnormal results are displayed) ?Labs Reviewed  ?COMPREHENSIVE METABOLIC PANEL  ?CBC WITH DIFFERENTIAL/PLATELET  ? ? ?EKG ?EKG Interpretation ? ?Date/Time:  Tuesday May 31 2021 10:52:30 EDT ?Ventricular Rate:  88 ?PR Interval:  148 ?QRS Duration: 81 ?QT Interval:  387 ?QTC Calculation: 469 ?R Axis:   85 ?Text Interpretation: Sinus rhythm Borderline right axis deviation Borderline repolarization abnormality No previous ECGs available Confirmed by Fredia Sorrow [phone]) on 05/31/2021 11:22:03  AM ? ?Radiology ?No results found. ? ?Procedures ?Procedures  ? ? ?Medications Ordered in ED ?Medications  ?0.9 %  sodium chloride infusion ( Intravenous New Bag/Given 05/31/21 1107)  ?famotidine (PEPCID) IVPB 20 mg premix (20 mg Intravenous New Bag/Given 05/31/21 1116)  ?sodium chloride 0.9 % bolus 1,000 mL (1,000 mLs Intravenous New Bag/Given 05/31/21 1107)  ?albuterol (PROVENTIL) (2.5 MG/3ML) 0.083% nebulizer solution 2.5 mg (2.5 mg Nebulization Given 05/31/21 1116)  ? ? ?ED Course/ Medical Decision Making/ A&P ?  ?                        ?Medical Decision Making ?Amount and/or Complexity of Data Reviewed ?Labs: ordered. ?Radiology: ordered. ? ?Risk ?OTC drugs. ?Prescription drug management. ? ? ?  CRITICAL CARE ?Performed by: Fredia Sorrow ?Total critical care time: 45 minutes ?Critical care time was exclusive of separately billable procedures and treating other patients. ?Critical care was necessary to treat or prevent imminent or life-threatening deterioration. ?Critical care was time spent personally by me on the following activities: development of treatment plan with patient and/or surrogate as well as nursing, discussions with consultants, evaluation of patient's response to treatment, examination of patient, obtaining history from patient or surrogate, ordering and performing treatments and interventions, ordering and review of laboratory studies, ordering and review of radiographic studies, pulse oximetry and re-evaluation of patient's condition. ? ?Patient arrived here blood pressure still below 90.  Oxygen sats 91% on room air patient started on 2 L of oxygen.  We will give IV fluids.  Patient with a Kenalog steroid allergy not sure what other steroids he can take some but avoid any steroids at this point in time.  Will give some Pepcid.  Does not need more Benadryl at this time.  Does not need more epinephrine at this time.  Patient moving air well no tongue swelling no lip swelling.  However patient  feels as if he is not moving air well. ? ?Get labs chest x-ray and will monitor.  Patient will also get EKG.  Patient may very well require admission. ? ?Patient doing much better.  Blood pressures now normalized.  Patient's la

## 2021-08-31 DIAGNOSIS — E1165 Type 2 diabetes mellitus with hyperglycemia: Secondary | ICD-10-CM

## 2021-08-31 HISTORY — DX: Type 2 diabetes mellitus with hyperglycemia: E11.65

## 2022-08-10 DIAGNOSIS — M47816 Spondylosis without myelopathy or radiculopathy, lumbar region: Secondary | ICD-10-CM

## 2022-08-10 HISTORY — DX: Spondylosis without myelopathy or radiculopathy, lumbar region: M47.816

## 2022-12-12 DIAGNOSIS — H6120 Impacted cerumen, unspecified ear: Secondary | ICD-10-CM

## 2022-12-12 HISTORY — DX: Impacted cerumen, unspecified ear: H61.20

## 2023-04-17 DIAGNOSIS — H903 Sensorineural hearing loss, bilateral: Secondary | ICD-10-CM | POA: Insufficient documentation

## 2023-04-17 HISTORY — DX: Sensorineural hearing loss, bilateral: H90.3

## 2023-05-22 DIAGNOSIS — I350 Nonrheumatic aortic (valve) stenosis: Secondary | ICD-10-CM

## 2023-05-22 HISTORY — DX: Nonrheumatic aortic (valve) stenosis: I35.0

## 2023-07-23 DIAGNOSIS — N189 Chronic kidney disease, unspecified: Secondary | ICD-10-CM | POA: Insufficient documentation

## 2023-07-23 DIAGNOSIS — N400 Enlarged prostate without lower urinary tract symptoms: Secondary | ICD-10-CM | POA: Insufficient documentation

## 2023-07-23 DIAGNOSIS — I10 Essential (primary) hypertension: Secondary | ICD-10-CM | POA: Insufficient documentation

## 2023-07-23 DIAGNOSIS — E119 Type 2 diabetes mellitus without complications: Secondary | ICD-10-CM | POA: Insufficient documentation

## 2023-07-23 DIAGNOSIS — I6529 Occlusion and stenosis of unspecified carotid artery: Secondary | ICD-10-CM | POA: Insufficient documentation

## 2023-07-23 DIAGNOSIS — M1990 Unspecified osteoarthritis, unspecified site: Secondary | ICD-10-CM | POA: Insufficient documentation

## 2023-07-24 ENCOUNTER — Ambulatory Visit

## 2023-07-24 VITALS — BP 146/68 | HR 78 | Ht 64.0 in | Wt 232.8 lb

## 2023-07-24 DIAGNOSIS — I10 Essential (primary) hypertension: Secondary | ICD-10-CM | POA: Diagnosis present

## 2023-07-24 DIAGNOSIS — E782 Mixed hyperlipidemia: Secondary | ICD-10-CM | POA: Diagnosis present

## 2023-07-24 DIAGNOSIS — R079 Chest pain, unspecified: Secondary | ICD-10-CM

## 2023-07-24 DIAGNOSIS — E669 Obesity, unspecified: Secondary | ICD-10-CM

## 2023-07-24 DIAGNOSIS — R600 Localized edema: Secondary | ICD-10-CM

## 2023-07-24 DIAGNOSIS — I350 Nonrheumatic aortic (valve) stenosis: Secondary | ICD-10-CM | POA: Diagnosis not present

## 2023-07-24 HISTORY — DX: Chest pain, unspecified: R07.9

## 2023-07-24 HISTORY — DX: Obesity, unspecified: E66.9

## 2023-07-24 HISTORY — DX: Localized edema: R60.0

## 2023-07-24 MED ORDER — ASPIRIN 81 MG PO TBEC: 81.0000 mg | DELAYED_RELEASE_TABLET | Freq: Every day | ORAL | 3 refills | Status: AC

## 2023-07-24 NOTE — Assessment & Plan Note (Signed)
 Suboptimal today. Advised to monitor at home they do have a device but do not use it frequently. Target below 130/80 mmHg. Continue amlodipine  10 mg once daily Continue losartan  100 mg once daily.

## 2023-07-24 NOTE — Progress Notes (Signed)
 Cardiology Consultation:    Date:  07/24/2023   ID:  Ronald [name], DOB 08/17/1950, MRN [id number]  PCP:  Ronald Coup, PA-C  Cardiologist:  Ronald Evans Birdie Beveridge, MD   Referring MD: Ronald [name], *   No chief complaint on file.    ASSESSMENT AND PLAN:   Ronald [name] 73 year old male with history of moderate aortic stenosis, hypertension, hyperlipidemia, diabetes mellitus type 2, lumbar spinal stenosis with chronic back pain, obesity, former smoker [quit in 1994], high suspicion for sleep apnea based on apneic spells and snoring but declines to get tested or use CPAP. Now here for further evaluation in the setting of aortic stenosis and symptoms of chest pressure and tightness with exertion gradually progressing.  Problem List Items Addressed This Visit     Benign essential hypertension   Suboptimal today. Advised to monitor at home they do have a device but do not use it frequently. Target below 130/80 mmHg. Continue amlodipine  10 mg once daily Continue losartan  100 mg once daily.       Relevant Medications   fenofibrate  160 MG tablet   aspirin EC 81 MG tablet   Hyperlipidemia   No recent lipid panel. Does have risk factors and diabetes. Continue with atorvastatin 40 mg once daily.  Follow-up fasting lipid panel on follow-up visits and we will escalate statin regimen based on cardiac cath results.       Relevant Medications   fenofibrate  160 MG tablet   aspirin EC 81 MG tablet   Other Relevant Orders   Lipid panel   Moderate aortic stenosis - Primary   Reviewed the findings with regards to moderate aortic valve stenosis, reviewed the nature of the disease in terms of narrowing and thickening of the valve opening which can put strain on the heart muscle and if getting to be severe and associated with symptoms will need intervention which could include surgical versus percutaneous valve implant. Will continue to follow-up with repeat echocardiogram in  about 12 months.    Currently will be undergoing evaluation for coronary artery disease given symptoms suggestive of progressive angina as reviewed below under chest pain.       Relevant Medications   fenofibrate  160 MG tablet   aspirin EC 81 MG tablet   Other Relevant Orders   EKG 12-Lead (Completed)   CBC   Basic Metabolic Panel (BMET)   Chest pain on exertion   Chest pain on exertion, progressive over the last few months. Walking up to 300 feet with symptoms, relieving with rest. Suggestive of angina. Does have significant cardiovascular risk factors.  Given his high risk factor, progressive symptoms, underlying moderate aortic stenosis, further evaluation for coronary artery disease recommended strongly.  Given the baseline EKG findings as reviewed below, would recommend definitive assessment with cardiac catheterization.  Discussed procedure cardiac catheterization in detail.  The do have good information about this given his wife's recent procedure. Shared Decision Making/Informed Consent{ The risks [stroke (1 in 1000), death (1 in 1000), kidney failure [usually temporary] (1 in 500), bleeding (1 in 200), allergic reaction [possibly serious] (1 in 200)], benefits (diagnostic support and management of coronary artery disease) and alternatives of a cardiac catheterization were discussed in detail with him and his wife and he is willing to proceed.  Tentatively to be scheduled at [facility]. Advised to avoid any moderate to heavy exertion until this is completed. Advised to start taking aspirin 81 mg once daily. Will obtain baseline blood work prior to  the procedure.  He does understand potential options for revascularization with stenting versus need for surgical revascularization depending on findings of cardiac cath.       Obesity (BMI 35.0-39.9 without comorbidity)   Advised about harmful effects of obesity. Advised to continue with dietary calorie  restriction. Advised to avoid adding extra salt to his diet.       Relevant Medications   insulin  lispro (HUMALOG) 100 UNIT/ML KwikPen   pioglitazone (ACTOS) 30 MG tablet   Insulin  Glargine (BASAGLAR KWIKPEN) 100 UNIT/ML   Bilateral lower extremity edema   Symptoms likely in the setting of diastolic dysfunction and dietary salt indiscretion.  Recommended to reduce sodium intake in the diet. Will obtain proBNP. Will request right heart cath to be performed if no significant obstructive coronary artery disease noted on cardiac cath.      Relevant Orders   Pro b natriuretic peptide (BNP)   Return to clinic tentatively in 3 to 4 weeks post cardiac cath.   History of Present Illness:    Ronald [name] is a 73 y.o. male who is being seen today for the evaluation of moderate aortic stenosis at the request of Ronald [name], Ronald [name], *.   Has history of moderate aortic stenosis, hypertension, hyperlipidemia, diabetes mellitus type 2 lumbar spinal stenosis with chronic back pain, obesity, former smoker [quit 1994].  Suspected to have sleep apnea and apneic spells at night but declines to get tested or use CPAP. Denies any prior history of MI, CHF, CVA.  Reports remote history of normal stress test.  Pleasant gentleman here for the visit accompanied by his wife.  Mentions for the last few months he has been noticing symptoms of chest pressure and tightness walking up to 300 feet.  Has to rest and symptoms resolved within a minute.  Symptoms have been gradually progressing. Denies orthopnea. Denies any palpitations, lightheadedness, syncopal episodes. Does have mild bilateral lower extremity pitting pedal edema extending up to the knees.  Wife does mention he adds salt to his foods even though she does not cook salty meals.  Good compliance with current medications at home including amlodipine , atorvastatin, losartan .  Currently not on aspirin.  EKG in the clinic today shows sinus  rhythm heart rate 78/min, anteroseptal Q waves, borderline LVH criteria with inferolateral ST segment changes and T wave inversions possible repolarization abnormalities, cannot exclude ischemia.  Echocardiogram from 05-01-2023 done at [facility] reported technically difficult study, moderate aortic stenosis with mean gradient 23 mmHg and V-max 3.2 m/s, LVEF 65 to 70% with grade 1 diastolic dysfunction, trace Ronald, pulmonary insufficiency and TR.  Images not available to review myself.  Blood work from 03/06/2023 noted point-of-care hemoglobin A1c 8.5.   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   Inactive since 06/28/07     Chronic kidney disease    cyst on L kidney   Diabetes mellitus without complication (HCC)    Facet arthropathy, lumbar 08/10/2022   Hyperlipidemia 03/16/2014   Hypertension    Impingement syndrome of left shoulder 09/27/2017   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   S/p PDT x 2  11/02, and 8/04 s/p avastin 01/20/05 inactive since 06/28/07     Moderate aortic stenosis 05/22/2023   Nuclear sclerotic cataract of right eye 05/20/2015   Primary osteoarthritis of left hip 03/16/2014   Pure hypercholesterolemia, unspecified 06/25/2015   S/P lumbar laminectomy 01/14/2020   Spinal stenosis of lumbar region without neurogenic claudication 06/16/2019   Type 2 diabetes mellitus with hyperglycemia, with long-term current use of insulin  (HCC) 08/31/2021   Wax in ear 12/12/2022    Past Surgical History:  Procedure Laterality Date   CHOLECYSTECTOMY     EYE  SURGERY     FRACTURE SURGERY     L arm   LUMBAR LAMINECTOMY/DECOMPRESSION MICRODISCECTOMY Left 01/14/2020   Procedure: Laminectomy - Lumbar three-Lumbar four - Lumbar four-Lumbar five - left;  Surgeon: Isadora Mar, MD;  Location: [facility] OR;  Service: Neurosurgery;  Laterality: Left;   PROSTATE SURGERY     s/p TURP 03/13/18    Current Medications: Current Meds  Medication Sig   amLODipine  (NORVASC ) 10 MG tablet Take 10 mg by mouth daily.   aspirin EC 81 MG tablet Take 1 tablet (81 mg total) by mouth daily. Swallow whole.   atorvastatin (LIPITOR) 40 MG tablet Take 40 mg by mouth at bedtime.   Besifloxacin HCl (BESIVANCE) 0.6 % SUSP Apply 1 drop to eye every 8 (eight) hours.   celecoxib  (CELEBREX ) 100 MG capsule Take 100 mg by mouth daily.   diphenhydrAMINE  (BENADRYL ) 25 MG tablet Take 1 tablet (25 mg total) by mouth every 6 (six) hours as needed for allergies or itching.   doxycycline (PERIOSTAT) 20 MG tablet Take 20 mg by mouth 2 (two) times daily.   EPINEPHrine 0.3 mg/0.3 mL IJ SOAJ injection Inject 0.3 mg into the muscle as needed for anaphylaxis.    erythromycin ophthalmic ointment Place 1 Application into the right eye 4 (four) times daily.   famotidine  (PEPCID ) 20 MG tablet Take 1 tablet (20 mg total) by mouth 2 (two) times daily.   fenofibrate  160 MG tablet Take 160 mg by mouth daily.   fenofibrate  micronized (LOFIBRA) 134 MG capsule Take 134 mg by mouth daily.   glipiZIDE  (GLUCOTROL ) 10 MG tablet Take 10 mg by mouth 2 (two) times daily.   glucose blood (FREESTYLE LITE) test strip 1 each by Other route as directed.   ibuprofen (ADVIL) 200 MG tablet Take 200 mg by mouth every 6 (six) hours as needed for mild pain (pain score 1-3).   insulin  aspart (NOVOLOG ) 100 UNIT/ML FlexPen Inject 5-6 Units into the skin daily as needed for high blood sugar.    Insulin  Glargine (BASAGLAR KWIKPEN) 100 UNIT/ML Inject 80 Units into the skin daily. Adjust as directed   insulin  lispro (HUMALOG) 100  UNIT/ML KwikPen Inject 15 Units into the skin 2 (two) times daily.   losartan  (COZAAR ) 100 MG tablet Take 100 mg by mouth daily.   meloxicam (MOBIC) 15 MG tablet Take 15 mg by mouth daily.   metFORMIN  (GLUCOPHAGE ) 500 MG tablet Take 500 mg by mouth daily.   methocarbamol  (ROBAXIN ) 500 MG tablet Take 1 tablet (500 mg total) by mouth every 6 (six) hours as needed for muscle spasms.   Multiple Vitamins-Minerals (PRESERVISION AREDS PO) Take 2 tablets by mouth daily.   pioglitazone (ACTOS) 30 MG tablet Take 1 tablet by mouth daily.   topiramate (TOPAMAX) 25 MG tablet Take 25 mg by mouth every 6 (six) hours.   traMADol (ULTRAM) 50 MG tablet Take 50 mg by mouth every 6 (six)  hours as needed for moderate pain (pain score 4-6).     Allergies:   Kenalog [triamcinolone acetonide] and Lidocaine    Social History   Socioeconomic History   Marital status: Married    Spouse name: Not on file   Number of children: Not on file   Years of education: Not on file   Highest education level: Not on file  Occupational History   Not on file  Tobacco Use   Smoking status: Former   Smokeless tobacco: Former  Advertising account planner   Vaping status: Never Used  Substance and Sexual Activity   Alcohol use: Never   Drug use: Never   Sexual activity: Not on file  Other Topics Concern   Not on file  Social History Narrative   Not on file   Social Drivers of Health   Financial Resource Strain: Low Risk  (01/02/2023)   Received from [facility]   Overall Financial Resource Strain (CARDIA)    Difficulty of Paying Living Expenses: Not hard at all  Food Insecurity: No Food Insecurity (01/02/2023)   Received from [facility]   Hunger Vital Sign    Worried About Running Out of Food in the Last Year: Never true    Ran Out of Food in the Last Year: Never true  Transportation Needs: No Transportation Needs (01/02/2023)   Received from [facility] - Transportation    Lack of Transportation (Medical): No     Lack of Transportation (Non-Medical): No  Physical Activity: Not on file  Stress: Not on file  Social Connections: Not on file     Family History: The patient's family history is not on file. ROS:   Please see the history of present illness.    All 14 point review of systems negative except as described per history of present illness.  EKGs/Labs/Other Studies Reviewed:    The following studies were reviewed today:   EKG:  EKG Interpretation Date/Time:  Tuesday Jul 24 2023 10:52:28 EDT Ventricular Rate:  78 PR Interval:  160 QRS Duration:  72 QT Interval:  384 QTC Calculation: 437 R Axis:   69  Text Interpretation: Normal sinus rhythm Septal infarct , age undetermined T wave abnormality, consider inferolateral ischemia When compared with ECG of 31-May-2021 10:52, PREVIOUS ECG IS PRESENT Confirmed by Bertha Broad reddy [phone]) on 07/24/2023 11:12:34 AM    Recent Labs: No results found for requested labs within last 365 days.  Recent Lipid Panel No results found for: "CHOL", "TRIG", "HDL", "CHOLHDL", "VLDL", "LDLCALC", "LDLDIRECT"  Physical Exam:    VS:  BP (!) 146/68   Pulse 78   Ht 5\' 4"  (1.626 m)   Wt 232 lb 12.8 oz (105.6 kg)   SpO2 93%   BMI 39.96 kg/m     Wt Readings from Last 3 Encounters:  07/24/23 232 lb 12.8 oz (105.6 kg)  05/31/21 231 lb (104.8 kg)  01/14/20 233 lb 6.4 oz (105.9 kg)     GENERAL:  Well nourished, well developed in no acute distress NECK: No JVD; No carotid bruits CARDIAC: RRR, S1 and S2 present, no murmurs, no rubs, no gallops CHEST:  Clear to auscultation without rales, wheezing or rhonchi  Extremities: No pitting pedal edema. Pulses bilaterally symmetric with radial 2+ and dorsalis pedis 2+ NEUROLOGIC:  Alert and oriented x 3  Medication Adjustments/Labs and Tests Ordered: Current medicines are reviewed at length with the patient today.  Concerns regarding medicines are outlined above.  Orders Placed This Encounter  Procedures   CBC   Basic Metabolic Panel (BMET)   Lipid panel   Pro b natriuretic peptide (BNP)   EKG 12-Lead   Meds ordered this encounter  Medications   aspirin EC 81 MG tablet    Sig: Take 1 tablet (81 mg total) by mouth daily. Swallow whole.    Dispense:  90 tablet    Refill:  3    Signed, Ivana Nicastro reddy Tiyana Galla, MD, MPH, [facility]. 07/24/2023 11:55 AM     [facility]

## 2023-07-24 NOTE — Assessment & Plan Note (Signed)
 Symptoms likely in the setting of diastolic dysfunction and dietary salt indiscretion.  Recommended to reduce sodium intake in the diet. Will obtain proBNP. Will request right heart cath to be performed if no significant obstructive coronary artery disease noted on cardiac cath.

## 2023-07-24 NOTE — Assessment & Plan Note (Signed)
 No recent lipid panel. Does have risk factors and diabetes. Continue with atorvastatin 40 mg once daily.  Follow-up fasting lipid panel on follow-up visits and we will escalate statin regimen based on cardiac cath results.

## 2023-07-24 NOTE — Patient Instructions (Signed)
 Medication Instructions:  Your physician has recommended you make the following change in your medication:   START: Aspirin 81 mg daily  *If you need a refill on your cardiac medications before your next appointment, please call your pharmacy*  Lab Work: Your physician recommends that you return for lab work in:   Labs today: CBC, BMP  If you have labs (blood work) drawn today and your tests are completely normal, you will receive your results only by: MyChart Message (if you have MyChart) OR A paper copy in the mail If you have any lab test that is abnormal or we need to change your treatment, we will call you to review the results.  Testing/Procedures:  [place] [facility] DEPT OF [place]. [facility] AT [facility] [place] [address] [place], [place] [address] [place] [address] Dept: [phone] Loc: [phone]  Ronald [name]  07/24/2023  You are scheduled for a Cardiac Catheterization on Wednesday, May 28 with Dr. Peter Sw[place].  1. Please arrive at the [facility] (Main Entrance A) at [facility]: [address] [place], [place] [address] at 6:30 AM (This time is 2 hour(s) before your procedure to ensure your preparation).   Free valet parking service is available. You will check in at ADMITTING. The support person will be asked to wait in the waiting room.  It is OK to have someone drop you off and come back when you are ready to be discharged.    Special note: Every effort is made to have your procedure done on time. Please understand that emergencies sometimes delay scheduled procedures.  2. Diet: Do not eat solid foods after midnight.  The patient may have clear liquids until 5am upon the day of the procedure.  3. Labs: You will need to have blood drawn on Tuesday, May 20 at [facility]: [address], [address], [place]. You do not need to be fasting.  4. Medication instructions in preparation for your  procedure:   Contrast Allergy: No  Do not take Novolog  on the morning of your procedure  Do not take Diabetes Med Glucophage  (Metformin ) and Glipizide  on the day of the procedure and HOLD 48 HOURS AFTER THE PROCEDURE.  On the morning of your procedure, take your Aspirin 81 mg and any morning medicines NOT listed above.  You may use sips of water.  5. Plan to go home the same day, you will only stay overnight if medically necessary. 6. Bring a current list of your medications and current insurance cards. 7. You MUST have a responsible person to drive you home. 8. Someone MUST be with you the first 24 hours after you arrive home or your discharge will be delayed. 9. Please wear clothes that are easy to get on and off and wear slip-on shoes.  Thank you for allowing us  to care for you!   -- [facility]   Follow-Up: At [facility], you and your health needs are our priority.  As part of our continuing mission to provide you with exceptional heart care, our providers are all part of one team.  This team includes your primary Cardiologist (physician) and Advanced Practice Providers or APPs (Physician Assistants and Nurse Practitioners) who all work together to provide you with the care you need, when you need it.  Your next appointment:   3 week(s)  Provider:   Bertha Broad, MD    We recommend  signing up for the patient portal called "MyChart".  Sign up information is provided on this After Visit Summary.  MyChart is used to connect with patients for Virtual Visits (Telemedicine).  Patients are able to view lab/test results, encounter notes, upcoming appointments, etc.  Non-urgent messages can be sent to your provider as well.   To learn more about what you can do with MyChart, go to [email or web address].   Other Instructions None

## 2023-07-24 NOTE — Assessment & Plan Note (Signed)
 Advised about harmful effects of obesity. Advised to continue with dietary calorie restriction. Advised to avoid adding extra salt to his diet.

## 2023-07-24 NOTE — Assessment & Plan Note (Signed)
 Chest pain on exertion, progressive over the last few months. Walking up to 300 feet with symptoms, relieving with rest. Suggestive of angina. Does have significant cardiovascular risk factors.  Given his high risk factor, progressive symptoms, underlying moderate aortic stenosis, further evaluation for coronary artery disease recommended strongly.  Given the baseline EKG findings as reviewed below, would recommend definitive assessment with cardiac catheterization.  Discussed procedure cardiac catheterization in detail.  The do have good information about this given his wife's recent procedure. Shared Decision Making/Informed Consent{ The risks [stroke (1 in 1000), death (1 in 1000), kidney failure [usually temporary] (1 in 500), bleeding (1 in 200), allergic reaction [possibly serious] (1 in 200)], benefits (diagnostic support and management of coronary artery disease) and alternatives of a cardiac catheterization were discussed in detail with him and his wife and he is willing to proceed.  Tentatively to be scheduled at [facility]. Advised to avoid any moderate to heavy exertion until this is completed. Advised to start taking aspirin 81 mg once daily. Will obtain baseline blood work prior to the procedure.  He does understand potential options for revascularization with stenting versus need for surgical revascularization depending on findings of cardiac cath.

## 2023-07-24 NOTE — H&P (View-Only) (Signed)
 Cardiology Consultation:    Date:  07/24/2023   ID:  Ronald [name], DOB 08/17/1950, MRN [id number]  PCP:  Ronald Coup, PA-C  Cardiologist:  Ronald Evans Birdie Beveridge, MD   Referring MD: Ronald [name], *   No chief complaint on file.    ASSESSMENT AND PLAN:   Ronald [name] 73 year old male with history of moderate aortic stenosis, hypertension, hyperlipidemia, diabetes mellitus type 2, lumbar spinal stenosis with chronic back pain, obesity, former smoker [quit in 1994], high suspicion for sleep apnea based on apneic spells and snoring but declines to get tested or use CPAP. Now here for further evaluation in the setting of aortic stenosis and symptoms of chest pressure and tightness with exertion gradually progressing.  Problem List Items Addressed This Visit     Benign essential hypertension   Suboptimal today. Advised to monitor at home they do have a device but do not use it frequently. Target below 130/80 mmHg. Continue amlodipine  10 mg once daily Continue losartan  100 mg once daily.       Relevant Medications   fenofibrate  160 MG tablet   aspirin EC 81 MG tablet   Hyperlipidemia   No recent lipid panel. Does have risk factors and diabetes. Continue with atorvastatin 40 mg once daily.  Follow-up fasting lipid panel on follow-up visits and we will escalate statin regimen based on cardiac cath results.       Relevant Medications   fenofibrate  160 MG tablet   aspirin EC 81 MG tablet   Other Relevant Orders   Lipid panel   Moderate aortic stenosis - Primary   Reviewed the findings with regards to moderate aortic valve stenosis, reviewed the nature of the disease in terms of narrowing and thickening of the valve opening which can put strain on the heart muscle and if getting to be severe and associated with symptoms will need intervention which could include surgical versus percutaneous valve implant. Will continue to follow-up with repeat echocardiogram in  about 12 months.    Currently will be undergoing evaluation for coronary artery disease given symptoms suggestive of progressive angina as reviewed below under chest pain.       Relevant Medications   fenofibrate  160 MG tablet   aspirin EC 81 MG tablet   Other Relevant Orders   EKG 12-Lead (Completed)   CBC   Basic Metabolic Panel (BMET)   Chest pain on exertion   Chest pain on exertion, progressive over the last few months. Walking up to 300 feet with symptoms, relieving with rest. Suggestive of angina. Does have significant cardiovascular risk factors.  Given his high risk factor, progressive symptoms, underlying moderate aortic stenosis, further evaluation for coronary artery disease recommended strongly.  Given the baseline EKG findings as reviewed below, would recommend definitive assessment with cardiac catheterization.  Discussed procedure cardiac catheterization in detail.  The do have good information about this given his wife's recent procedure. Shared Decision Making/Informed Consent{ The risks [stroke (1 in 1000), death (1 in 1000), kidney failure [usually temporary] (1 in 500), bleeding (1 in 200), allergic reaction [possibly serious] (1 in 200)], benefits (diagnostic support and management of coronary artery disease) and alternatives of a cardiac catheterization were discussed in detail with him and his wife and he is willing to proceed.  Tentatively to be scheduled at [facility]. Advised to avoid any moderate to heavy exertion until this is completed. Advised to start taking aspirin 81 mg once daily. Will obtain baseline blood work prior to  the procedure.  He does understand potential options for revascularization with stenting versus need for surgical revascularization depending on findings of cardiac cath.       Obesity (BMI 35.0-39.9 without comorbidity)   Advised about harmful effects of obesity. Advised to continue with dietary calorie  restriction. Advised to avoid adding extra salt to his diet.       Relevant Medications   insulin  lispro (HUMALOG) 100 UNIT/ML KwikPen   pioglitazone (ACTOS) 30 MG tablet   Insulin  Glargine (BASAGLAR KWIKPEN) 100 UNIT/ML   Bilateral lower extremity edema   Symptoms likely in the setting of diastolic dysfunction and dietary salt indiscretion.  Recommended to reduce sodium intake in the diet. Will obtain proBNP. Will request right heart cath to be performed if no significant obstructive coronary artery disease noted on cardiac cath.      Relevant Orders   Pro b natriuretic peptide (BNP)   Return to clinic tentatively in 3 to 4 weeks post cardiac cath.   History of Present Illness:    Ronald [name] is a 73 y.o. male who is being seen today for the evaluation of moderate aortic stenosis at the request of Ronald [name], Ronald [name], *.   Has history of moderate aortic stenosis, hypertension, hyperlipidemia, diabetes mellitus type 2 lumbar spinal stenosis with chronic back pain, obesity, former smoker [quit 1994].  Suspected to have sleep apnea and apneic spells at night but declines to get tested or use CPAP. Denies any prior history of MI, CHF, CVA.  Reports remote history of normal stress test.  Pleasant gentleman here for the visit accompanied by his wife.  Mentions for the last few months he has been noticing symptoms of chest pressure and tightness walking up to 300 feet.  Has to rest and symptoms resolved within a minute.  Symptoms have been gradually progressing. Denies orthopnea. Denies any palpitations, lightheadedness, syncopal episodes. Does have mild bilateral lower extremity pitting pedal edema extending up to the knees.  Wife does mention he adds salt to his foods even though she does not cook salty meals.  Good compliance with current medications at home including amlodipine , atorvastatin, losartan .  Currently not on aspirin.  EKG in the clinic today shows sinus  rhythm heart rate 78/min, anteroseptal Q waves, borderline LVH criteria with inferolateral ST segment changes and T wave inversions possible repolarization abnormalities, cannot exclude ischemia.  Echocardiogram from 05-01-2023 done at [facility] reported technically difficult study, moderate aortic stenosis with mean gradient 23 mmHg and V-max 3.2 m/s, LVEF 65 to 70% with grade 1 diastolic dysfunction, trace Ronald, pulmonary insufficiency and TR.  Images not available to review myself.  Blood work from 03/06/2023 noted point-of-care hemoglobin A1c 8.5.   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   Inactive since 06/28/07     Chronic kidney disease    cyst on L kidney   Diabetes mellitus without complication (HCC)    Facet arthropathy, lumbar 08/10/2022   Hyperlipidemia 03/16/2014   Hypertension    Impingement syndrome of left shoulder 09/27/2017   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   S/p PDT x 2  11/02, and 8/04 s/p avastin 01/20/05 inactive since 06/28/07     Moderate aortic stenosis 05/22/2023   Nuclear sclerotic cataract of right eye 05/20/2015   Primary osteoarthritis of left hip 03/16/2014   Pure hypercholesterolemia, unspecified 06/25/2015   S/P lumbar laminectomy 01/14/2020   Spinal stenosis of lumbar region without neurogenic claudication 06/16/2019   Type 2 diabetes mellitus with hyperglycemia, with long-term current use of insulin  (HCC) 08/31/2021   Wax in ear 12/12/2022    Past Surgical History:  Procedure Laterality Date   CHOLECYSTECTOMY     EYE  SURGERY     FRACTURE SURGERY     L arm   LUMBAR LAMINECTOMY/DECOMPRESSION MICRODISCECTOMY Left 01/14/2020   Procedure: Laminectomy - Lumbar three-Lumbar four - Lumbar four-Lumbar five - left;  Surgeon: Isadora Mar, MD;  Location: [facility] OR;  Service: Neurosurgery;  Laterality: Left;   PROSTATE SURGERY     s/p TURP 03/13/18    Current Medications: Current Meds  Medication Sig   amLODipine  (NORVASC ) 10 MG tablet Take 10 mg by mouth daily.   aspirin EC 81 MG tablet Take 1 tablet (81 mg total) by mouth daily. Swallow whole.   atorvastatin (LIPITOR) 40 MG tablet Take 40 mg by mouth at bedtime.   Besifloxacin HCl (BESIVANCE) 0.6 % SUSP Apply 1 drop to eye every 8 (eight) hours.   celecoxib  (CELEBREX ) 100 MG capsule Take 100 mg by mouth daily.   diphenhydrAMINE  (BENADRYL ) 25 MG tablet Take 1 tablet (25 mg total) by mouth every 6 (six) hours as needed for allergies or itching.   doxycycline (PERIOSTAT) 20 MG tablet Take 20 mg by mouth 2 (two) times daily.   EPINEPHrine 0.3 mg/0.3 mL IJ SOAJ injection Inject 0.3 mg into the muscle as needed for anaphylaxis.    erythromycin ophthalmic ointment Place 1 Application into the right eye 4 (four) times daily.   famotidine  (PEPCID ) 20 MG tablet Take 1 tablet (20 mg total) by mouth 2 (two) times daily.   fenofibrate  160 MG tablet Take 160 mg by mouth daily.   fenofibrate  micronized (LOFIBRA) 134 MG capsule Take 134 mg by mouth daily.   glipiZIDE  (GLUCOTROL ) 10 MG tablet Take 10 mg by mouth 2 (two) times daily.   glucose blood (FREESTYLE LITE) test strip 1 each by Other route as directed.   ibuprofen (ADVIL) 200 MG tablet Take 200 mg by mouth every 6 (six) hours as needed for mild pain (pain score 1-3).   insulin  aspart (NOVOLOG ) 100 UNIT/ML FlexPen Inject 5-6 Units into the skin daily as needed for high blood sugar.    Insulin  Glargine (BASAGLAR KWIKPEN) 100 UNIT/ML Inject 80 Units into the skin daily. Adjust as directed   insulin  lispro (HUMALOG) 100  UNIT/ML KwikPen Inject 15 Units into the skin 2 (two) times daily.   losartan  (COZAAR ) 100 MG tablet Take 100 mg by mouth daily.   meloxicam (MOBIC) 15 MG tablet Take 15 mg by mouth daily.   metFORMIN  (GLUCOPHAGE ) 500 MG tablet Take 500 mg by mouth daily.   methocarbamol  (ROBAXIN ) 500 MG tablet Take 1 tablet (500 mg total) by mouth every 6 (six) hours as needed for muscle spasms.   Multiple Vitamins-Minerals (PRESERVISION AREDS PO) Take 2 tablets by mouth daily.   pioglitazone (ACTOS) 30 MG tablet Take 1 tablet by mouth daily.   topiramate (TOPAMAX) 25 MG tablet Take 25 mg by mouth every 6 (six) hours.   traMADol (ULTRAM) 50 MG tablet Take 50 mg by mouth every 6 (six)  hours as needed for moderate pain (pain score 4-6).     Allergies:   Kenalog [triamcinolone acetonide] and Lidocaine    Social History   Socioeconomic History   Marital status: Married    Spouse name: Not on file   Number of children: Not on file   Years of education: Not on file   Highest education level: Not on file  Occupational History   Not on file  Tobacco Use   Smoking status: Former   Smokeless tobacco: Former  Advertising account planner   Vaping status: Never Used  Substance and Sexual Activity   Alcohol use: Never   Drug use: Never   Sexual activity: Not on file  Other Topics Concern   Not on file  Social History Narrative   Not on file   Social Drivers of Health   Financial Resource Strain: Low Risk  (01/02/2023)   Received from [facility]   Overall Financial Resource Strain (CARDIA)    Difficulty of Paying Living Expenses: Not hard at all  Food Insecurity: No Food Insecurity (01/02/2023)   Received from [facility]   Hunger Vital Sign    Worried About Running Out of Food in the Last Year: Never true    Ran Out of Food in the Last Year: Never true  Transportation Needs: No Transportation Needs (01/02/2023)   Received from [facility] - Transportation    Lack of Transportation (Medical): No     Lack of Transportation (Non-Medical): No  Physical Activity: Not on file  Stress: Not on file  Social Connections: Not on file     Family History: The patient's family history is not on file. ROS:   Please see the history of present illness.    All 14 point review of systems negative except as described per history of present illness.  EKGs/Labs/Other Studies Reviewed:    The following studies were reviewed today:   EKG:  EKG Interpretation Date/Time:  Tuesday Jul 24 2023 10:52:28 EDT Ventricular Rate:  78 PR Interval:  160 QRS Duration:  72 QT Interval:  384 QTC Calculation: 437 R Axis:   69  Text Interpretation: Normal sinus rhythm Septal infarct , age undetermined T wave abnormality, consider inferolateral ischemia When compared with ECG of 31-May-2021 10:52, PREVIOUS ECG IS PRESENT Confirmed by Bertha Broad reddy [phone]) on 07/24/2023 11:12:34 AM    Recent Labs: No results found for requested labs within last 365 days.  Recent Lipid Panel No results found for: "CHOL", "TRIG", "HDL", "CHOLHDL", "VLDL", "LDLCALC", "LDLDIRECT"  Physical Exam:    VS:  BP (!) 146/68   Pulse 78   Ht 5\' 4"  (1.626 m)   Wt 232 lb 12.8 oz (105.6 kg)   SpO2 93%   BMI 39.96 kg/m     Wt Readings from Last 3 Encounters:  07/24/23 232 lb 12.8 oz (105.6 kg)  05/31/21 231 lb (104.8 kg)  01/14/20 233 lb 6.4 oz (105.9 kg)     GENERAL:  Well nourished, well developed in no acute distress NECK: No JVD; No carotid bruits CARDIAC: RRR, S1 and S2 present, no murmurs, no rubs, no gallops CHEST:  Clear to auscultation without rales, wheezing or rhonchi  Extremities: No pitting pedal edema. Pulses bilaterally symmetric with radial 2+ and dorsalis pedis 2+ NEUROLOGIC:  Alert and oriented x 3  Medication Adjustments/Labs and Tests Ordered: Current medicines are reviewed at length with the patient today.  Concerns regarding medicines are outlined above.  Orders Placed This Encounter  Procedures   CBC   Basic Metabolic Panel (BMET)   Lipid panel   Pro b natriuretic peptide (BNP)   EKG 12-Lead   Meds ordered this encounter  Medications   aspirin EC 81 MG tablet    Sig: Take 1 tablet (81 mg total) by mouth daily. Swallow whole.    Dispense:  90 tablet    Refill:  3    Signed, Ivana Nicastro reddy Tiyana Galla, MD, MPH, [facility]. 07/24/2023 11:55 AM     [facility]

## 2023-07-24 NOTE — Assessment & Plan Note (Signed)
 Reviewed the findings with regards to moderate aortic valve stenosis, reviewed the nature of the disease in terms of narrowing and thickening of the valve opening which can put strain on the heart muscle and if getting to be severe and associated with symptoms will need intervention which could include surgical versus percutaneous valve implant. Will continue to follow-up with repeat echocardiogram in about 12 months.    Currently will be undergoing evaluation for coronary artery disease given symptoms suggestive of progressive angina as reviewed below under chest pain.

## 2023-07-25 LAB — CBC
Hematocrit: 42.5 % (ref 37.5–51.0)
Hemoglobin: 13.8 g/dL (ref 13.0–17.7)
MCH: 29.1 pg (ref 26.6–33.0)
MCHC: 32.5 g/dL (ref 31.5–35.7)
MCV: 90 fL (ref 79–97)
Platelets: 357 10*3/uL (ref 150–450)
RBC: 4.74 x10E6/uL (ref 4.14–5.80)
RDW: 13.8 % (ref 11.6–15.4)
WBC: 8.2 10*3/uL (ref 3.4–10.8)

## 2023-07-25 LAB — LIPID PANEL
Chol/HDL Ratio: 4.5 ratio (ref 0.0–5.0)
Cholesterol, Total: 190 mg/dL (ref 100–199)
HDL: 42 mg/dL (ref >39)
LDL Chol Calc (NIH): 114 mg/dL — ABNORMAL HIGH (ref 0–99)
Triglycerides: 195 mg/dL — ABNORMAL HIGH (ref 0–149)
VLDL Cholesterol Cal: 34 mg/dL (ref 5–40)

## 2023-07-25 LAB — BASIC METABOLIC PANEL WITH GFR
BUN/Creatinine Ratio: 18 (ref 10–24)
BUN: 22 mg/dL (ref 8–27)
CO2: 22 mmol/L (ref 20–29)
Calcium: 9.5 mg/dL (ref 8.6–10.2)
Chloride: 98 mmol/L (ref 96–106)
Creatinine, Ser: 1.25 mg/dL (ref 0.76–1.27)
Glucose: 272 mg/dL — ABNORMAL HIGH (ref 70–99)
Potassium: 5.1 mmol/L (ref 3.5–5.2)
Sodium: 137 mmol/L (ref 134–144)
eGFR: 60 mL/min/{1.73_m2} (ref >59)

## 2023-07-25 LAB — PRO B NATRIURETIC PEPTIDE: NT-Pro BNP: 148 pg/mL (ref 0–486)

## 2023-07-31 ENCOUNTER — Telehealth: Payer: Self-pay | Admitting: *Deleted

## 2023-07-31 NOTE — Telephone Encounter (Signed)
 Cardiac Catheterization scheduled at [facility] for: Wednesday Aug 01, 2023 8:30 AM Arrival time [facility] Main Entrance A at: 6:30 AM  Nothing to eat after midnight prior to procedure, clear liquids until 5 AM day of procedure.   Medication instructions: -Hold:  Metformin -day of procedure and 48 hours post procedure  Insulin /Glipizide -AM of procedure -Other usual morning medications can be taken with sips of water including aspirin 81 mg.  Plan to go home the same day, you will only stay overnight if medically necessary.  You must have responsible adult to drive you home.  Someone must be with you the first 24 hours after you arrive home.  Reviewed procedure instructions with patient's wife (DPR), Fredrik Jensen.

## 2023-08-01 ENCOUNTER — Other Ambulatory Visit: Payer: Self-pay

## 2023-08-01 ENCOUNTER — Ambulatory Visit: Payer: Self-pay

## 2023-08-01 ENCOUNTER — Other Ambulatory Visit (HOSPITAL_COMMUNITY): Payer: Self-pay

## 2023-08-01 ENCOUNTER — Encounter (HOSPITAL_COMMUNITY): Payer: Self-pay | Admitting: Cardiology

## 2023-08-01 ENCOUNTER — Encounter (HOSPITAL_COMMUNITY)
Admission: RE | Disposition: A | Discharge status: Home / Self Care | Payer: Self-pay | Source: Home / Self Care | Attending: Cardiology

## 2023-08-01 ENCOUNTER — Ambulatory Visit (HOSPITAL_COMMUNITY)
Admission: RE | Admit: 2023-08-01 | Discharge: 2023-08-01 | Disposition: A | Discharge status: Home / Self Care | Attending: Cardiology | Admitting: Cardiology

## 2023-08-01 DIAGNOSIS — I272 Pulmonary hypertension, unspecified: Secondary | ICD-10-CM | POA: Diagnosis not present

## 2023-08-01 DIAGNOSIS — I209 Angina pectoris, unspecified: Secondary | ICD-10-CM | POA: Diagnosis present

## 2023-08-01 DIAGNOSIS — Z794 Long term (current) use of insulin: Secondary | ICD-10-CM | POA: Diagnosis not present

## 2023-08-01 DIAGNOSIS — Z87891 Personal history of nicotine dependence: Secondary | ICD-10-CM | POA: Insufficient documentation

## 2023-08-01 DIAGNOSIS — Z7984 Long term (current) use of oral hypoglycemic drugs: Secondary | ICD-10-CM | POA: Insufficient documentation

## 2023-08-01 DIAGNOSIS — Z7982 Long term (current) use of aspirin: Secondary | ICD-10-CM | POA: Insufficient documentation

## 2023-08-01 DIAGNOSIS — I350 Nonrheumatic aortic (valve) stenosis: Secondary | ICD-10-CM | POA: Diagnosis not present

## 2023-08-01 DIAGNOSIS — E1122 Type 2 diabetes mellitus with diabetic chronic kidney disease: Secondary | ICD-10-CM | POA: Diagnosis not present

## 2023-08-01 DIAGNOSIS — Z79899 Other long term (current) drug therapy: Secondary | ICD-10-CM | POA: Insufficient documentation

## 2023-08-01 DIAGNOSIS — M48061 Spinal stenosis, lumbar region without neurogenic claudication: Secondary | ICD-10-CM | POA: Diagnosis present

## 2023-08-01 DIAGNOSIS — I129 Hypertensive chronic kidney disease with stage 1 through stage 4 chronic kidney disease, or unspecified chronic kidney disease: Secondary | ICD-10-CM | POA: Diagnosis not present

## 2023-08-01 DIAGNOSIS — Z955 Presence of coronary angioplasty implant and graft: Secondary | ICD-10-CM

## 2023-08-01 DIAGNOSIS — I10 Essential (primary) hypertension: Secondary | ICD-10-CM | POA: Diagnosis present

## 2023-08-01 DIAGNOSIS — N189 Chronic kidney disease, unspecified: Secondary | ICD-10-CM | POA: Diagnosis not present

## 2023-08-01 DIAGNOSIS — I25119 Atherosclerotic heart disease of native coronary artery with unspecified angina pectoris: Secondary | ICD-10-CM | POA: Insufficient documentation

## 2023-08-01 DIAGNOSIS — E1159 Type 2 diabetes mellitus with other circulatory complications: Secondary | ICD-10-CM | POA: Diagnosis present

## 2023-08-01 DIAGNOSIS — I2510 Atherosclerotic heart disease of native coronary artery without angina pectoris: Secondary | ICD-10-CM

## 2023-08-01 DIAGNOSIS — E785 Hyperlipidemia, unspecified: Secondary | ICD-10-CM | POA: Diagnosis present

## 2023-08-01 DIAGNOSIS — R079 Chest pain, unspecified: Secondary | ICD-10-CM

## 2023-08-01 DIAGNOSIS — E7800 Pure hypercholesterolemia, unspecified: Secondary | ICD-10-CM | POA: Diagnosis not present

## 2023-08-01 DIAGNOSIS — E1165 Type 2 diabetes mellitus with hyperglycemia: Secondary | ICD-10-CM

## 2023-08-01 HISTORY — PX: RIGHT/LEFT HEART CATH AND CORONARY ANGIOGRAPHY: CATH118266

## 2023-08-01 HISTORY — PX: CORONARY STENT INTERVENTION: CATH118234

## 2023-08-01 HISTORY — DX: Atherosclerotic heart disease of native coronary artery without angina pectoris: I25.10

## 2023-08-01 HISTORY — DX: Presence of coronary angioplasty implant and graft: Z95.5

## 2023-08-01 LAB — POCT I-STAT 7, (LYTES, BLD GAS, ICA,H+H)
Acid-base deficit: 5 mmol/L — ABNORMAL HIGH (ref 0.0–2.0)
Bicarbonate: 19.9 mmol/L — ABNORMAL LOW (ref 20.0–28.0)
Calcium, Ion: 1.19 mmol/L (ref 1.15–1.40)
HCT: 36 % — ABNORMAL LOW (ref 39.0–52.0)
Hemoglobin: 12.2 g/dL — ABNORMAL LOW (ref 13.0–17.0)
O2 Saturation: 94 %
Potassium: 3.9 mmol/L (ref 3.5–5.1)
Sodium: 142 mmol/L (ref 135–145)
TCO2: 21 mmol/L — ABNORMAL LOW (ref 22–32)
pCO2 arterial: 35.5 mmHg (ref 32–48)
pH, Arterial: 7.357 (ref 7.35–7.45)
pO2, Arterial: 72 mmHg — ABNORMAL LOW (ref 83–108)

## 2023-08-01 LAB — GLUCOSE, CAPILLARY
Glucose-Capillary: 110 mg/dL — ABNORMAL HIGH (ref 70–99)
Glucose-Capillary: 121 mg/dL — ABNORMAL HIGH (ref 70–99)

## 2023-08-01 LAB — POCT I-STAT EG7
Acid-base deficit: 3 mmol/L — ABNORMAL HIGH (ref 0.0–2.0)
Acid-base deficit: 4 mmol/L — ABNORMAL HIGH (ref 0.0–2.0)
Bicarbonate: 22.3 mmol/L (ref 20.0–28.0)
Bicarbonate: 23 mmol/L (ref 20.0–28.0)
Calcium, Ion: 1.19 mmol/L (ref 1.15–1.40)
Calcium, Ion: 1.24 mmol/L (ref 1.15–1.40)
HCT: 36 % — ABNORMAL LOW (ref 39.0–52.0)
HCT: 36 % — ABNORMAL LOW (ref 39.0–52.0)
Hemoglobin: 12.2 g/dL — ABNORMAL LOW (ref 13.0–17.0)
Hemoglobin: 12.2 g/dL — ABNORMAL LOW (ref 13.0–17.0)
O2 Saturation: 66 %
O2 Saturation: 66 %
Potassium: 3.8 mmol/L (ref 3.5–5.1)
Potassium: 3.9 mmol/L (ref 3.5–5.1)
Sodium: 141 mmol/L (ref 135–145)
Sodium: 142 mmol/L (ref 135–145)
TCO2: 24 mmol/L (ref 22–32)
TCO2: 24 mmol/L (ref 22–32)
pCO2, Ven: 42.4 mmHg — ABNORMAL LOW (ref 44–60)
pCO2, Ven: 43.6 mmHg — ABNORMAL LOW (ref 44–60)
pH, Ven: 7.329 (ref 7.25–7.43)
pH, Ven: 7.33 (ref 7.25–7.43)
pO2, Ven: 37 mmHg (ref 32–45)
pO2, Ven: 37 mmHg (ref 32–45)

## 2023-08-01 LAB — POCT ACTIVATED CLOTTING TIME: Activated Clotting Time: 314 s

## 2023-08-01 SURGERY — RIGHT/LEFT HEART CATH AND CORONARY ANGIOGRAPHY
Anesthesia: LOCAL

## 2023-08-01 MED ORDER — AMLODIPINE BESYLATE 5 MG PO TABS: 10.0000 mg | ORAL_TABLET | Freq: Every day | ORAL | Status: DC

## 2023-08-01 MED ORDER — IOHEXOL 350 MG/ML SOLN
INTRAVENOUS | Status: DC | PRN
  Administered 2023-08-01: 90 mL

## 2023-08-01 MED ORDER — LOSARTAN POTASSIUM 50 MG PO TABS: 100.0000 mg | ORAL_TABLET | Freq: Every day | ORAL | Status: DC

## 2023-08-01 MED ORDER — GLIPIZIDE 10 MG PO TABS: 10.0000 mg | ORAL_TABLET | Freq: Two times a day (BID) | ORAL | Status: DC

## 2023-08-01 MED ORDER — HEPARIN SODIUM (PORCINE) 1000 UNIT/ML IJ SOLN
INTRAMUSCULAR | Status: DC | PRN
  Administered 2023-08-01 (×2): 5000 [IU] via INTRAVENOUS

## 2023-08-01 MED ORDER — CLOPIDOGREL BISULFATE 300 MG PO TABS
ORAL_TABLET | ORAL | Status: DC | PRN
  Administered 2023-08-01: 600 mg via ORAL

## 2023-08-01 MED ORDER — CLOPIDOGREL BISULFATE 75 MG PO TABS: 75.0000 mg | ORAL_TABLET | Freq: Every day | ORAL | Status: DC

## 2023-08-01 MED ORDER — BASAGLAR KWIKPEN 100 UNIT/ML ~~LOC~~ SOPN: 60.0000 [IU] | PEN_INJECTOR | Freq: Every day | SUBCUTANEOUS | Status: DC

## 2023-08-01 MED ORDER — SODIUM CHLORIDE 0.9% FLUSH: 3.0000 mL | INTRAVENOUS | Status: DC | PRN

## 2023-08-01 MED ORDER — HEPARIN (PORCINE) IN NACL 1000-0.9 UT/500ML-% IV SOLN
INTRAVENOUS | Status: DC | PRN
  Administered 2023-08-01 (×3): 500 mL

## 2023-08-01 MED ORDER — ASPIRIN 81 MG PO TBEC: 81.0000 mg | DELAYED_RELEASE_TABLET | Freq: Every day | ORAL | Status: DC

## 2023-08-01 MED ORDER — MIDAZOLAM HCL 2 MG/2ML IJ SOLN
INTRAMUSCULAR | Status: DC | PRN
  Administered 2023-08-01: 2 mg via INTRAVENOUS
  Administered 2023-08-01: 1 mg via INTRAVENOUS

## 2023-08-01 MED ORDER — ROSUVASTATIN CALCIUM 40 MG PO TABS
40.0000 mg | ORAL_TABLET | Freq: Every day | ORAL | 3 refills | Status: AC
  Filled 2023-08-01: qty 90, 90d supply, fill #0

## 2023-08-01 MED ORDER — SODIUM CHLORIDE 0.9 % WEIGHT BASED INFUSION
3.0000 mL/kg/h | INTRAVENOUS | Status: DC
  Administered 2023-08-01: 3 mL/kg/h via INTRAVENOUS

## 2023-08-01 MED ORDER — SODIUM CHLORIDE 0.9 % WEIGHT BASED INFUSION: 1.0000 mL/kg/h | INTRAVENOUS | Status: DC

## 2023-08-01 MED ORDER — TETRACAINE HCL 1 % IJ SOLN
20.0000 mg | Freq: Once | INTRAMUSCULAR | Status: DC
  Filled 2023-08-01 (×2): qty 2

## 2023-08-01 MED ORDER — FENOFIBRATE 134 MG PO CAPS: 134.0000 mg | ORAL_CAPSULE | Freq: Every day | ORAL | Status: DC

## 2023-08-01 MED ORDER — NITROGLYCERIN 0.4 MG SL SUBL
0.4000 mg | SUBLINGUAL_TABLET | SUBLINGUAL | 2 refills | Status: AC | PRN
  Filled 2023-08-01: qty 25, 7d supply, fill #0

## 2023-08-01 MED ORDER — CLOPIDOGREL BISULFATE 75 MG PO TABS
75.0000 mg | ORAL_TABLET | Freq: Every day | ORAL | 3 refills | Status: AC
  Filled 2023-08-01: qty 90, 90d supply, fill #0

## 2023-08-01 MED ORDER — MIDAZOLAM HCL 2 MG/2ML IJ SOLN
INTRAMUSCULAR | Status: AC
  Filled 2023-08-01: qty 2

## 2023-08-01 MED ORDER — NITROGLYCERIN 1 MG/10 ML FOR IR/CATH LAB
INTRA_ARTERIAL | Status: DC | PRN
Start: 2023-08-01 — End: 2023-08-01
  Administered 2023-08-01: 200 ug via INTRACORONARY

## 2023-08-01 MED ORDER — CLOPIDOGREL BISULFATE 300 MG PO TABS
ORAL_TABLET | ORAL | Status: AC
  Filled 2023-08-01: qty 2

## 2023-08-01 MED ORDER — SODIUM CHLORIDE 0.9% FLUSH: 3.0000 mL | Freq: Two times a day (BID) | INTRAVENOUS | Status: DC

## 2023-08-01 MED ORDER — ONDANSETRON HCL 4 MG/2ML IJ SOLN: 4.0000 mg | Freq: Four times a day (QID) | INTRAMUSCULAR | Status: DC | PRN

## 2023-08-01 MED ORDER — HEPARIN SODIUM (PORCINE) 1000 UNIT/ML IJ SOLN
INTRAMUSCULAR | Status: AC
  Filled 2023-08-01: qty 10

## 2023-08-01 MED ORDER — TETRACAINE HCL 1 % IJ SOLN
INTRAMUSCULAR | Status: DC | PRN
  Administered 2023-08-01 (×2): 2 mg via INTRADERMAL

## 2023-08-01 MED ORDER — VERAPAMIL HCL 2.5 MG/ML IV SOLN
INTRAVENOUS | Status: DC | PRN
  Administered 2023-08-01: 10 mL via INTRA_ARTERIAL

## 2023-08-01 MED ORDER — ACETAMINOPHEN 325 MG PO TABS: 650.0000 mg | ORAL_TABLET | ORAL | Status: DC | PRN

## 2023-08-01 MED ORDER — FENTANYL CITRATE (PF) 100 MCG/2ML IJ SOLN
INTRAMUSCULAR | Status: DC | PRN
  Administered 2023-08-01 (×2): 25 ug via INTRAVENOUS

## 2023-08-01 MED ORDER — FENTANYL CITRATE (PF) 100 MCG/2ML IJ SOLN
INTRAMUSCULAR | Status: AC
  Filled 2023-08-01: qty 2

## 2023-08-01 MED ORDER — ATORVASTATIN CALCIUM 80 MG PO TABS: 80.0000 mg | ORAL_TABLET | Freq: Every day | ORAL | Status: DC

## 2023-08-01 MED ORDER — SODIUM CHLORIDE 0.9 % IV SOLN: 250.0000 mL | INTRAVENOUS | Status: DC | PRN

## 2023-08-01 MED ORDER — VERAPAMIL HCL 2.5 MG/ML IV SOLN
INTRAVENOUS | Status: AC
  Filled 2023-08-01: qty 2

## 2023-08-01 MED ORDER — NITROGLYCERIN 1 MG/10 ML FOR IR/CATH LAB
INTRA_ARTERIAL | Status: DC
Start: 2023-08-01 — End: 2023-08-01
  Filled 2023-08-01: qty 10

## 2023-08-01 SURGICAL SUPPLY — 16 items
BALLOON EMERGE MR 2.5X12 (BALLOONS) IMPLANT
CATH 5FR JL3.5 JR4 ANG PIG MP (CATHETERS) IMPLANT
CATH BALLN WEDGE 5F 110CM (CATHETERS) IMPLANT
CATH LAUNCHER 6FR JR4 (CATHETERS) IMPLANT
DEVICE RAD COMP TR BAND LRG (VASCULAR PRODUCTS) IMPLANT
GLIDESHEATH SLEND SS 6F .021 (SHEATH) IMPLANT
GUIDEWIRE .025 260CM (WIRE) IMPLANT
GUIDEWIRE INQWIRE 1.5J.035X260 (WIRE) IMPLANT
KIT ENCORE 26 ADVANTAGE (KITS) IMPLANT
KIT SINGLE USE MANIFOLD (KITS) IMPLANT
PACK CARDIAC CATHETERIZATION (CUSTOM PROCEDURE TRAY) ×1 IMPLANT
SET ATX-X65L (MISCELLANEOUS) IMPLANT
SHEATH GLIDE SLENDER 4/5FR (SHEATH) IMPLANT
SHEATH PROBE COVER 6X72 (BAG) IMPLANT
STENT SYNERGY XD 4.0X16 (Permanent Stent) IMPLANT
WIRE ASAHI PROWATER 180CM (WIRE) IMPLANT

## 2023-08-01 NOTE — Discharge Instructions (Signed)

## 2023-08-01 NOTE — Discharge Summary (Signed)
 Discharge Summary for Same Day PCI   Patient ID: Ronald [name] MRN: [id number]; DOB: 02-26-1951  Admit date: 08/01/2023 Discharge date: 08/01/2023  Primary Care Provider: Ruperto Coup, PA-C  Primary Cardiologist: Daymon Evans Madireddy, MD  Primary Electrophysiologist:  None   Discharge Diagnoses    Principal Problem:   CAD (coronary artery disease) Active Problems:   Hypertension   Hyperlipidemia LDL goal <55   Moderate aortic stenosis   Spinal stenosis of lumbar region without neurogenic claudication   Type 2 diabetes mellitus with hyperglycemia, with long-term current use of insulin  (HCC)   S/P drug eluting coronary stent placement  Diagnostic Studies/Procedures    Cardiac Catheterization 08/01/2023:    Ost RCA lesion is 40% stenosed.   Prox RCA lesion is 90% stenosed.   Mid RCA lesion is 30% stenosed.   Ost LM lesion is 25% stenosed.   Prox LAD lesion is 30% stenosed.   Mid Cx lesion is 35% stenosed.   A drug-eluting stent was successfully placed using a STENT SYNERGY XD 4.0X16.   Post intervention, there is a 0% residual stenosis.   The left ventricular systolic function is normal.   LV end diastolic pressure is mildly elevated.   The left ventricular ejection fraction is 55-65% by visual estimate.   Hemodynamic findings consistent with mild pulmonary hypertension.   There is moderate aortic valve stenosis.   Single vessel obstructive CAD with high grade stenosis in the proximal RCA Normal LV function Moderate aortic stenosis. Mean AV gradient 23 mm Hg. AVA 1.13 cm squared. Index 0.54 Mildly elevated LV filling pressures. LVEDP 26 mm Hg. PCWP 27/23, mean 22 mm Hg Mild Pulmonary HTN PAP 42/25, mean 34 mm Hg RA pressure 18/14, mean 13 mm Hg Cardiac output 5.31 L/min, index 2.53.  Successful PCI of the proximal RCA with DES   Plan: anticipate same day DC. DAPT for 6 months. Will follow AS with serial Echos. Needs more aggressive lipid management with goal  LDL < 55. Will increase lipitor to 80 mg daily. Consider referral to lipid clinic.  _____________   History of Present Illness     Ronald [name] is a 73 y.o. male with history of moderate aortic stenosis, hypertension, hyperlipidemia, diabetes mellitus type 2, lumbar spinal stenosis with chronic back pain, obesity, former smoker (quit in 1994), high suspicion for sleep apnea based but declines to get tested or use CPAP, exertional chest pain.   He was seen by Dr. Ronell Coe on 07/24/2023 for exertional chest pain that had been progressive over the last few months, with walking even short distances, relieved at rest. EKG while in clinic showed sinus rhythm with anteroseptal Q waves, borderline LVH, and inferolateral ST segement changes with TWI. The changes were thought to be repolarization abnormalities but ischemia could not be excluded. It was decided to proceed with cardiac catheterization. Patient was scheduled for 08/01/2023.  Cardiac catheterization was arranged for further evaluation.  Hospital Course     The patient underwent cardiac cath as noted above with Dr. [place]. Plan for DAPT with ASA/Plavix for at least 6 months. The patient was seen by cardiac rehab while in short stay. There were no observed complications post cath. Radial cath site was re-evaluated prior to discharge and found to be stable without any complications. Instructions/precautions regarding cath site care were given prior to discharge.  Ronald [name] was seen by Dr. [place] and determined stable for discharge home. Follow up with our office has been arranged. Medications are  listed below. Pertinent changes include starting DAPT with Plavix , switching back to Crestor  40 mg daily per patient request.  Other recommendations at discharge include serial echocardiograms to continue to follow aortic stenosis and referral to lipid clinic to aid in lowering LDL to under 55.    _____________  Cath/PCI Registry  Performance & Quality Measures: Aspirin  prescribed? - Yes ADP Receptor Inhibitor (Plavix /Clopidogrel , Brilinta/Ticagrelor or Effient/Prasugrel) prescribed (includes medically managed patients)? - Yes High Intensity Statin (Lipitor 40-80mg  or Crestor  20-40mg ) prescribed? - Yes For EF <40%, was ACEI/ARB prescribed? - Yes For EF <40%, Aldosterone Antagonist (Spironolactone or Eplerenone) prescribed? - Not Applicable (EF >/= 40%) Cardiac Rehab Phase II ordered (Included Medically managed Patients)? - Yes _____________  Discharge Vitals Blood pressure (!) 148/61, pulse 68, temperature 98.2 F (36.8 C), temperature source Oral, resp. rate 18, height 5\' 4"  (1.626 m), weight 106.6 kg, SpO2 94%.  Filed Weights   08/01/23 0640  Weight: 106.6 kg   Last Labs & Radiologic Studies    CBC No results for input(s): "WBC", "NEUTROABS", "HGB", "HCT", "MCV", "PLT" in the last 72 hours. Basic Metabolic Panel No results for input(s): "NA", "K", "CL", "CO2", "GLUCOSE", "BUN", "CREATININE", "CALCIUM ", "MG", "PHOS" in the last 72 hours. Liver Function Tests No results for input(s): "AST", "ALT", "ALKPHOS", "BILITOT", "PROT", "ALBUMIN" in the last 72 hours. No results for input(s): "LIPASE", "AMYLASE" in the last 72 hours. High Sensitivity Troponin:   No results for input(s): "TROPONINIHS" in the last 720 hours.  BNP Invalid input(s): "POCBNP" D-Dimer No results for input(s): "DDIMER" in the last 72 hours. Hemoglobin A1C No results for input(s): "HGBA1C" in the last 72 hours. Fasting Lipid Panel No results for input(s): "CHOL", "HDL", "LDLCALC", "TRIG", "CHOLHDL", "LDLDIRECT" in the last 72 hours. Thyroid Function Tests No results for input(s): "TSH", "T4TOTAL", "T3FREE", "THYROIDAB" in the last 72 hours.  Invalid input(s): "FREET3" _____________  CARDIAC CATHETERIZATION Result Date: 08/01/2023   Estella Helling RCA lesion is 40% stenosed.   Prox RCA lesion is 90% stenosed.   Mid RCA lesion is 30% stenosed.   Ost  LM lesion is 25% stenosed.   Prox LAD lesion is 30% stenosed.   Mid Cx lesion is 35% stenosed.   A drug-eluting stent was successfully placed using a STENT SYNERGY XD 4.0X16.   Post intervention, there is a 0% residual stenosis.   The left ventricular systolic function is normal.   LV end diastolic pressure is mildly elevated.   The left ventricular ejection fraction is 55-65% by visual estimate.   Hemodynamic findings consistent with mild pulmonary hypertension.   There is moderate aortic valve stenosis. Single vessel obstructive CAD with high grade stenosis in the proximal RCA Normal LV function Moderate aortic stenosis. Mean AV gradient 23 mm Hg. AVA 1.13 cm squared. Index 0.54 Mildly elevated LV filling pressures. LVEDP 26 mm Hg. PCWP 27/23, mean 22 mm Hg Mild Pulmonary HTN PAP 42/25, mean 34 mm Hg RA pressure 18/14, mean 13 mm Hg Cardiac output 5.31 L/min, index 2.53. Successful PCI of the proximal RCA with DES Plan: anticipate same day DC. DAPT for 6 months. Will follow AS with serial Echos. Needs more aggressive lipid management with goal LDL < 55. Will increase lipitor to 80 mg daily. Consider referral to lipid clinic.   Disposition   Pt is being discharged home today in good condition per MD.  Follow-up Plans & Appointments   Future Appointments  Date Time Provider Department Center  08/20/2023 10:00 AM Madireddy, Daymon Evans, MD [facility]  None   Discharge Instructions     AMB Referral to Advanced Lipid Disorders Clinic   Complete by: As directed    Internal Lipid Clinic Referral Scheduling  Internal lipid clinic referrals are providers within [facility], who wish to refer established patients for routine management (help in starting PCSK9 inhibitor therapy) or advanced therapies.  Internal MD referral criteria:              1. All patients with LDL>190 mg/dL  2. All patients with Triglycerides >500 mg/dL  3. Patients with suspected or confirmed heterozygous familial hyperlipidemia (HeFH)  or homozygous familial hyperlipidemia (HoFH)  4. Patients with family history of suspicious for genetic dyslipidemia desiring genetic testing  5. Patients refractory to standard guideline based therapy  6. Patients with statin intolerance (failed 2 statins, one of which must be a high potency statin)  7. Patients who the provider desires to be seen by MD   Internal PharmD referral criteria:   1. Follow-up patients for medication management  2. Follow-up for compliance monitoring  3. Patients for drug education  4. Patients with statin intolerance  5. PCSK9 inhibitor education and prior authorization approvals  6. Patients with triglycerides <500 mg/dL  External Lipid Clinic Referral  External lipid clinic referrals are for providers outside of [facility], considered new clinic patients - automatically routed to MD schedule   Amb Referral to Cardiac Rehabilitation   Complete by: As directed    Diagnosis: Coronary Stents   After initial evaluation and assessments completed: Virtual Based Care may be provided alone or in conjunction with Phase 2 Cardiac Rehab based on patient barriers.: Yes   Intensive Cardiac Rehabilitation (ICR) [facility] location only OR Traditional Cardiac Rehabilitation (TCR) *If criteria for ICR are not met will enroll in TCR [facility] only): Yes   Call MD for:  persistant dizziness or light-headedness   Complete by: As directed    Call MD for:  redness, tenderness, or signs of infection (pain, swelling, redness, odor or green/yellow discharge around incision site)   Complete by: As directed    Discharge instructions   Complete by: As directed    PLEASE DO NOT MISS ANY DOSES OF YOUR PLAVIX!!!!! Also keep a log of you blood pressures and bring back to your follow up appt. Please call the office with any questions.   Patients taking blood thinners should generally stay away from medicines like ibuprofen, Advil, Motrin, naproxen, and Aleve due to risk of stomach bleeding.  You may take Tylenol  as directed or talk to your primary doctor about alternatives.  PLEASE ENSURE THAT YOU DO NOT RUN OUT OF YOUR PLAVIX. This medication is very important to remain on for at least 6 months. IF you have issues obtaining this medication due to cost please CALL the office 3-5 business days prior to running out in order to prevent missing doses of this medication.   Radial Site Care Refer to this sheet in the next few weeks. These instructions provide you with information on caring for yourself after your procedure. Your caregiver may also give you more specific instructions. Your treatment has been planned according to current medical practices, but problems sometimes occur. Call your caregiver if you have any problems or questions after your procedure.  HOME CARE INSTRUCTIONS You may shower the day after the procedure. Remove the bandage (dressing) and gently wash the site with plain soap and water. Gently pat the site dry.  Do not apply powder or lotion to the site.  Do not submerge the affected site in water for 3 to 5 days.  Inspect the site at least twice daily.  Do not flex or bend the affected arm for 24 hours.  No lifting over 5 pounds (2.3 kg) for 5 days after your procedure.  Do not drive home if you are discharged the same day of the procedure. Have someone else drive you.  You may drive 24 hours after the procedure unless otherwise instructed by your caregiver.   What to expect: Any bruising will usually fade within 1 to 2 weeks.  Blood that collects in the tissue (hematoma) may be painful to the touch. It should usually decrease in size and tenderness within 1 to 2 weeks.   SEEK IMMEDIATE MEDICAL CARE IF: You have unusual pain at the radial site.  You have redness, warmth, swelling, or pain at the radial site.  You have drainage (other than a small amount of blood on the dressing).  You have chills.  You have a fever or persistent symptoms for more than 72  hours.  You have a fever and your symptoms suddenly get worse.  Your arm becomes pale, cool, tingly, or numb.  You have heavy bleeding from the site. Hold pressure on the site.      Discharge Medications   Allergies as of 08/01/2023       Reactions   Kenalog [triamcinolone Acetonide] Anaphylaxis   Lidocaine  Hives, Itching        Medication List     STOP taking these medications    atorvastatin 40 MG tablet Commonly known as: LIPITOR   ibuprofen 200 MG tablet Commonly known as: ADVIL       TAKE these medications    amLODipine  10 MG tablet Commonly known as: NORVASC  Take 10 mg by mouth daily.   aspirin EC 81 MG tablet Take 1 tablet (81 mg total) by mouth daily. Swallow whole.   Basaglar KwikPen 100 UNIT/ML Inject 60 Units into the skin daily. Adjust as directed   clopidogrel 75 MG tablet Commonly known as: PLAVIX Take 1 tablet (75 mg total) by mouth daily with breakfast. Start taking on: Aug 02, 2023   fenofibrate  micronized 134 MG capsule Commonly known as: LOFIBRA Take 134 mg by mouth daily.   FREESTYLE LITE test strip Generic drug: glucose blood 1 each by Other route as directed.   glipiZIDE  10 MG tablet Commonly known as: GLUCOTROL  Take 10 mg by mouth 2 (two) times daily.   insulin  regular 100 units/mL injection Commonly known as: NOVOLIN R Inject 4-12 Units into the skin 2 (two) times daily as needed for high blood sugar.   losartan  100 MG tablet Commonly known as: COZAAR  Take 100 mg by mouth daily.   metFORMIN  500 MG tablet Commonly known as: GLUCOPHAGE  Take 500 mg by mouth 2 (two) times daily.   nitroGLYCERIN 0.4 MG SL tablet Commonly known as: Nitrostat Place 1 tablet (0.4 mg total) under the tongue every 5 (five) minutes as needed for chest pain.   rosuvastatin 40 MG tablet Commonly known as: CRESTOR Take 1 tablet (40 mg total) by mouth daily.   traMADol 50 MG tablet Commonly known as: ULTRAM Take 50 mg by mouth every 6 (six)  hours as needed for moderate pain (pain score 4-6).   VITAMIN D PO Take 1 capsule by mouth daily.         Allergies Allergies  Allergen Reactions   Kenalog [Triamcinolone Acetonide] Anaphylaxis   Lidocaine  Hives and Itching  Outstanding Labs/Studies   Patient will need serial echos in follow up to monitor aortic stenosis   Duration of Discharge Encounter   Greater than 30 minutes including physician time.  Signed, Jiles Mote, PA-C 08/01/2023, 1:08 PM

## 2023-08-01 NOTE — Progress Notes (Signed)
 CARDIAC REHAB PHASE I     Post stent education including site care, restrictions, risk factors, exercise guidelines, NTG use, antiplatelet therapy importance, heart healthy diabetic diet and CRP2 reviewed. All questions and concerns addressed. Will refer to [facility] for CRP2. Plan for home later today.    1230-1300 Ronny Colas, RN BSN 08/01/2023 12:57 PM

## 2023-08-01 NOTE — Interval H&P Note (Signed)
 History and Physical Interval Note:  08/01/2023 7:04 AM  Ronald [name]  has presented today for surgery, with the diagnosis of aortic stenosis.  The various methods of treatment have been discussed with the patient and family. After consideration of risks, benefits and other options for treatment, the patient has consented to  Procedure(s): RIGHT/LEFT HEART CATH AND CORONARY ANGIOGRAPHY (N/A) as a surgical intervention.  The patient's history has been reviewed, patient examined, no change in status, stable for surgery.  I have reviewed the patient's chart and labs.  Questions were answered to the patient's satisfaction.   Cath Lab Visit (complete for each Cath Lab visit)  Clinical Evaluation Leading to the Procedure:   ACS: No.  Non-ACS:    Anginal Classification: CCS III  Anti-ischemic medical therapy: Minimal Therapy (1 class of medications)  Non-Invasive Test Results: No non-invasive testing performed  Prior CABG: No previous CABG        Donata Fryer [facility] 08/01/2023 7:04 AM

## 2023-08-02 MED ORDER — EZETIMIBE 10 MG PO TABS: 10.0000 mg | ORAL_TABLET | Freq: Every day | ORAL | 3 refills | Status: AC

## 2023-08-02 NOTE — Telephone Encounter (Signed)
-----   Message from Daymon Evans Madireddy sent at 08/01/2023  5:28 PM EDT ----- Lindi Revering lab work shows stable electrolytes and kidney function.  Stable blood counts. Cholesterol panel shows LDL [bad] cholesterol not at goal. He had cardiac cath done today with severe RCA disease for which she got a stent.  I see statin dose was recommended to be increased to atorvastatin 80 mg once daily. In addition please let him know that I recommend starting Zetia 10 mg once daily.  Follow-up in the office tentatively in 2 to 3 weeks after the cath. Thank you.

## 2023-08-02 NOTE — Telephone Encounter (Signed)
 Results reviewed with pt as per Dr. Madireddy's note.  Pt verbalized understanding and had no additional questions. Routed to PCP.  Pt was changed to Crestor 40 mg after cath.

## 2023-08-06 ENCOUNTER — Telehealth: Payer: Self-pay

## 2023-08-06 NOTE — Telephone Encounter (Signed)
 Pt c/o medication issue:  1. Name of Medication: Crestor   2. How are you currently taking this medication (dosage and times per day)?   3. Are you having a reaction (difficulty breathing--STAT)?   4. What is your medication issue? This medicine raises his blood sugar. Does he need to go back to taking Atorvastatin ?   Pt c/o medication issue:  1. Name of Medication: Zetia  and  Fenofibrate   2. How are you currently taking this medication (dosage and times per day)?   3. Are you having a reaction (difficulty breathing--STAT)?   4. What is your medication issue?  Patient wants to know if patient should take all three of these medicine- Atorvastatin , Zetia  and Fenofibrate ?   Please call after 12:00

## 2023-08-06 NOTE — Telephone Encounter (Signed)
 Patient has two questions regarding his medications:   The Crestor  medication raises his blood sugar. Does he need to go back to taking Atorvastatin ?     Patient wants to know if patient should take all three of these medicine- Atorvastatin , Zetia  and Fenofibrate ?  Please advise.

## 2023-08-07 ENCOUNTER — Telehealth (HOSPITAL_COMMUNITY): Payer: Self-pay

## 2023-08-07 ENCOUNTER — Other Ambulatory Visit: Payer: Self-pay

## 2023-08-07 NOTE — Telephone Encounter (Signed)
 Outside Phase II ref, fax to [facility].

## 2023-08-07 NOTE — Telephone Encounter (Signed)
 Called the patient's wife Fredrik Jensen per DPR and informed her of Dr. Madireddy's recommendation below:  "We want to aggressively bring down your cholesterol numbers. Hence continue with Crestor  and Zetia . At this time you can hold off on fenofibrate , okay to discontinue fenofibrate .  Continue to monitor your sugar levels and diabetes as per your primary care.  I do not anticipate significant difference between atorvastatin  and Crestor  with regards to diabetes management and glucose control".  Patients wife Fredrik Jensen verbalized understanding and had no further questions at this time.

## 2023-08-20 ENCOUNTER — Ambulatory Visit

## 2023-08-20 VITALS — BP 150/60 | HR 72 | Ht 64.0 in | Wt 234.0 lb

## 2023-08-20 DIAGNOSIS — I350 Nonrheumatic aortic (valve) stenosis: Secondary | ICD-10-CM | POA: Diagnosis not present

## 2023-08-20 DIAGNOSIS — E785 Hyperlipidemia, unspecified: Secondary | ICD-10-CM | POA: Diagnosis not present

## 2023-08-20 DIAGNOSIS — I10 Essential (primary) hypertension: Secondary | ICD-10-CM | POA: Diagnosis present

## 2023-08-20 DIAGNOSIS — I25118 Atherosclerotic heart disease of native coronary artery with other forms of angina pectoris: Secondary | ICD-10-CM | POA: Insufficient documentation

## 2023-08-20 DIAGNOSIS — I5032 Chronic diastolic (congestive) heart failure: Secondary | ICD-10-CM | POA: Insufficient documentation

## 2023-08-20 HISTORY — DX: Chronic diastolic (congestive) heart failure: I50.32

## 2023-08-20 MED ORDER — FUROSEMIDE 20 MG PO TABS: ORAL_TABLET | ORAL | 3 refills | Status: AC

## 2023-08-20 NOTE — Progress Notes (Signed)
 Cardiology Consultation:    Date:  08/20/2023   ID:  Ronald [name], DOB Feb 12, 1951, MRN [id number]  PCP:  Ruperto Coup, PA-C  Cardiologist:  Daymon Evans Chauncy Mangiaracina, MD   Referring MD: Ruperto Coup, PA-C   Chief Complaint  Patient presents with   Follow-up     ASSESSMENT AND PLAN:   Ronald [name] 73 year old male patient with history of moderate aortic stenosis, hypertension, hyperlipidemia, diabetes mellitus type 2, lumbar spinal stenosis with chronic back pain, obesity, former smoker, high suspicion for sleep apnea, progressive symptoms of chest pain and shortness of breath with exertion now with cardiac cath identifying single obstructive coronary artery disease involving proximal RCA underwent PCI 07-24-2023 findings with right heart cath noted elevated LVEDP and PCWP with normal cardiac index.   Problem List Items Addressed This Visit     Benign essential hypertension   Suboptimal control at this time continue to monitor at home closely. Target blood pressure below 130/80 mmHg. Continue amlodipine  10 mg once daily Continue losartan  100 mg once daily. Starting furosemide 20 mg as below, if remains uncontrolled we will start beta-blockers.       Relevant Medications   furosemide (LASIX) 20 MG tablet   Hyperlipidemia LDL goal <55   Recent lipid panel from 07-24-2023 total cholesterol 190, HDL 42, LDL 114, triglycerides 195.  Transition to Crestor  40 mg once daily and Zetia  10 mg once daily after his cardiac catheterization Jul 24, 2023.  Tolerating well. Continue the same, target LDL below 55 mg/dL.       Relevant Medications   furosemide (LASIX) 20 MG tablet   Moderate aortic stenosis   Continue to monitor closely. Various symptoms of aortic stenosis reviewed extensively. Discussed intervention indication of severe aortic stenosis.  Will follow-up with repeat echocardiogram tentatively in November 2025, last echocardiogram was February 2025.  Follow-up in  the office after the echocardiogram tentatively in December 2025.       Relevant Medications   furosemide (LASIX) 20 MG tablet   Other Relevant Orders   ECHOCARDIOGRAM COMPLETE   CAD (coronary artery disease) - Primary   Symptomatic with angina, now underwent cardiac cath 07-24-2023 with severe single-vessel disease involving proximal RCA now s/p drug-eluting stent.  Noticed improvement in symptoms. Continue dual antiplatelet therapy with aspirin  81 mg once daily and Plavix  75 mg once daily tentatively for at least 6 to 12 months. Continue lipid-lowering therapy as discussed under hyperlipidemia, continue Crestor  40 mg once daily.  Recommended to continue with cardiac rehab as was prescribed after cardiac catheterization and revascularization..       Relevant Medications   furosemide (LASIX) 20 MG tablet   Chronic diastolic heart failure (HCC)   Right and left heart cath 07-24-2023 with LVEDP 26 mmHg, mean PA pressure 34 mmHg, PCWP 22 mmHg, cardiac index 2.53.  Advised about dietary salt and fluid restriction to below 2 g/day and 2 L/day respectively. Will initiate furosemide 20 mg to be used every other day for the next 7 days after which she will use it on an as-needed basis for weight gain over 2 to 3 pounds in a day or 4 to 5 pounds in a week.  Advised to monitor daily weights at home.  Continue with losartan  100 mg once daily Will monitor his response and at subsequent visits depending on his progress we will consider adding SGLT2 inhibitors. If from diabetic standpoint his PCP is going to initiate SGLT2 inhibitors it would be beneficial.  Relevant Medications   furosemide (LASIX) 20 MG tablet    Return to clinic to return in 6 months  History of Present Illness:    Ronald [name] is a 74 y.o. male who is being seen today for t follow-up visit. PCP is Ronald [name], Ronald Leopard, PA-C. Last visit with me in the office was 07-24-2023.  History of moderate aortic stenosis,  hypertension, hyperlipidemia, diabetes mellitus type 2, lumbar spinal stenosis with chronic back pain, obesity, former smoker [quit in 20 1994], high suspicion for sleep apnea  With progressive symptoms of shortness of breath and chest pain scheduled for cardiac cath and underwent 07-24-2023 noted single-vessel obstructive CAD involving proximal RCA with moderate aortic stenosis mean gradient 23 mmHg, LVEDP 26 mmHg with mean PA pressure 34 mmHg and mild pulmonary hypertension, elevated PCWP 22 mmHg, cardiac index 2.53. Underwent successful PCI of proximal RCA with drug-eluting stent.  Recent echocardiogram from 05-01-2023 at [facility] noted EF 65 to 70%, moderate AAS with mean gradient 23 mmHg and V-max 3.2 m/s grade 1 diastolic dysfunction  Here for follow-up visit, today accompanied by his wife.  Mentions overall symptoms have improved he is able to walk longer distances without getting short of breath or chest pain. Still pending to initiate rehab he is not too keen but his wife sees the benefit in the are planning to get started soon.  Mild bilateral pitting pedal edema no significant worsening. Mentions weight at home around 234 pounds.  Denies any blood in urine or stools. Taking his new medications statins and Zetia  without any side effects.  Lipid panel from 07-24-2023 noted total cholesterol 190, triglycerides 195, HDL 42, LDL 114, not optimal. NT proBNP 148. 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   Inactive since 06/28/07     Chronic kidney disease    cyst on L kidney   Diabetes mellitus  without complication (HCC)    Facet arthropathy, lumbar 08/10/2022   Hyperlipidemia 03/16/2014   Hypertension    Impingement syndrome of left shoulder 09/27/2017   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   S/p PDT x 2 11/02, and 8/04 s/p avastin 01/20/05 inactive since 06/28/07     Moderate aortic stenosis 05/22/2023   Nuclear sclerotic cataract of right eye 05/20/2015   Primary osteoarthritis of left hip 03/16/2014   Pure hypercholesterolemia, unspecified 06/25/2015   S/P lumbar laminectomy 01/14/2020   Spinal stenosis of lumbar region without neurogenic claudication 06/16/2019   Type 2 diabetes mellitus with hyperglycemia, with long-term current use of insulin  (HCC) 08/31/2021   Wax in ear 12/12/2022    Past Surgical History:  Procedure Laterality Date   CHOLECYSTECTOMY     CORONARY STENT INTERVENTION N/A 08/01/2023   Procedure: CORONARY STENT INTERVENTION;  Surgeon: Swaziland, Peter M, MD;  Location: [facility] INVASIVE CV LAB;  Service: Cardiovascular;  Laterality: N/A;   EYE SURGERY     FRACTURE SURGERY     L arm   LUMBAR LAMINECTOMY/DECOMPRESSION MICRODISCECTOMY Left 01/14/2020   Procedure: Laminectomy - Lumbar three-Lumbar four - Lumbar four-Lumbar five - left;  Surgeon: Isadora Mar, MD;  Location: [facility] OR;  Service: Neurosurgery;  Laterality: Left;   PROSTATE SURGERY     s/p TURP 03/13/18   RIGHT/LEFT HEART CATH AND CORONARY ANGIOGRAPHY N/A 08/01/2023   Procedure: RIGHT/LEFT HEART CATH AND CORONARY ANGIOGRAPHY;  Surgeon: Swaziland, Peter M, MD;  Location: [facility] INVASIVE CV LAB;  Service: Cardiovascular;  Laterality: N/A;    Current Medications: Current Meds  Medication Sig   amLODipine  (NORVASC ) 10 MG tablet Take 10 mg by mouth daily.   aspirin  EC 81 MG tablet Take 1 tablet (81 mg total) by mouth daily. Swallow whole.   clopidogrel  (PLAVIX ) 75 MG tablet Take 1 tablet (75 mg total) by mouth daily with breakfast.   ezetimibe  (ZETIA ) 10 MG tablet Take 1 tablet  (10 mg total) by mouth daily.   furosemide (LASIX) 20 MG tablet Take 20 mg every other day for 8 days. After 8 days, take 20 mg daily as needed for a weight gain of 2-3 pounds overnight or 5 pounds in one week.   glipiZIDE  (GLUCOTROL ) 10 MG tablet Take 10 mg by mouth 2 (two) times daily.   glucose blood (FREESTYLE LITE) test strip 1 each by Other route as directed.   Insulin  Glargine (BASAGLAR  KWIKPEN) 100 UNIT/ML Inject 60 Units into the skin daily. Adjust as directed   insulin  regular (NOVOLIN R) 100 units/mL injection Inject 4-12 Units into the skin 2 (two) times daily as needed for high blood sugar.   losartan  (COZAAR ) 100 MG tablet Take 100 mg by mouth daily.   metFORMIN  (GLUCOPHAGE ) 500 MG tablet Take 500 mg by mouth 2 (two) times daily.   nitroGLYCERIN  (NITROSTAT ) 0.4 MG SL tablet Place 1 tablet (0.4 mg total) under the tongue every 5 (five) minutes as needed for chest pain.   rosuvastatin  (CRESTOR ) 40 MG tablet Take 1 tablet (40 mg total) by mouth daily.   traMADol (ULTRAM) 50 MG tablet Take 50 mg by mouth every 6 (six) hours as needed for moderate pain (pain score 4-6).   VITAMIN D PO Take 1 capsule by mouth daily.     Allergies:   Kenalog [triamcinolone acetonide] and Lidocaine    Social History   Socioeconomic History   Marital status: Married    Spouse name: Not on file   Number of children: Not on file   Years of education: Not on file   Highest education level: Not on file  Occupational History   Not on file  Tobacco Use   Smoking status: Former   Smokeless tobacco: Former  Building services engineer status: Never Used  Substance and Sexual Activity   Alcohol use: Never   Drug use: Never   Sexual activity: Not on file  Other Topics Concern   Not on file  Social History Narrative   Not on file   Social Drivers of Health   Financial Resource Strain: Low Risk  (01/02/2023)   Received from [facility]   Overall Financial Resource Strain (CARDIA)    Difficulty of  Paying Living Expenses: Not hard at all  Food Insecurity: No Food Insecurity (01/02/2023)   Received from [facility]   Hunger Vital Sign    Within the past 12 months, you worried that your food would run out before you got the money to buy more.: Never true    Within the past 12 months, the food you bought just didn't last and you didn't have money to get more.: Never true  Transportation Needs: No Transportation Needs (01/02/2023)   Received from [facility]  PRAPARE - Administrator, Civil Service (Medical): No    Lack of Transportation (Non-Medical): No  Physical Activity: Not on file  Stress: Not on file  Social Connections: Not on file     Family History: The patient's family history is not on file. ROS:   Please see the history of present illness.    All 14 point review of systems negative except as described per history of present illness.  EKGs/Labs/Other Studies Reviewed:    The following studies were reviewed today:   EKG:       Recent Labs: 07/24/2023: BUN 22; Creatinine, Ser 1.25; NT-Pro BNP 148; Platelets 357 08/01/2023: Hemoglobin 12.2; Hemoglobin 12.2; Potassium 3.8; Potassium 3.9; Sodium 141; Sodium 142  Recent Lipid Panel    Component Value Date/Time   CHOL 190 07/24/2023 1317   TRIG 195 (H) 07/24/2023 1317   HDL 42 07/24/2023 1317   CHOLHDL 4.5 07/24/2023 1317   LDLCALC 114 (H) 07/24/2023 1317    Physical Exam:    VS:  BP (!) 150/60   Pulse 72   Ht 5' 4 (1.626 m)   Wt 234 lb (106.1 kg)   SpO2 92%   BMI 40.17 kg/m     Wt Readings from Last 3 Encounters:  08/20/23 234 lb (106.1 kg)  08/01/23 235 lb (106.6 kg)  07/24/23 232 lb 12.8 oz (105.6 kg)     GENERAL:  Well nourished, well developed in no acute distress NECK: No JVD; No carotid bruits CARDIAC: RRR, S1 and S2 present, 4/6 harsh ejection systolic murmur best heard in aortic area. CHEST:  Clear to auscultation without rales, wheezing or rhonchi  Extremities: Trace  pitting ankle edema. Pulses bilaterally symmetric with radial 2+ and dorsalis pedis 2+.  Right radial cath access site well-healed. NEUROLOGIC:  Alert and oriented x 3  Medication Adjustments/Labs and Tests Ordered: Current medicines are reviewed at length with the patient today.  Concerns regarding medicines are outlined above.  Orders Placed This Encounter  Procedures   ECHOCARDIOGRAM COMPLETE   Meds ordered this encounter  Medications   furosemide (LASIX) 20 MG tablet    Sig: Take 20 mg every other day for 8 days. After 8 days, take 20 mg daily as needed for a weight gain of 2-3 pounds overnight or 5 pounds in one week.    Dispense:  45 tablet    Refill:  3    Signed, Sunshyne Horvath reddy Saphyra Hutt, MD, MPH, [facility]. 08/20/2023 10:40 AM    [facility]

## 2023-08-20 NOTE — Assessment & Plan Note (Addendum)
 Symptomatic with angina, now underwent cardiac cath 07-24-2023 with severe single-vessel disease involving proximal RCA now s/p drug-eluting stent.  Noticed improvement in symptoms. Continue dual antiplatelet therapy with aspirin  81 mg once daily and Plavix  75 mg once daily tentatively for at least 6 to 12 months. Continue lipid-lowering therapy as discussed under hyperlipidemia, continue Crestor  40 mg once daily.  Recommended to continue with cardiac rehab as was prescribed after cardiac catheterization and revascularization.Aaron Aas

## 2023-08-20 NOTE — Assessment & Plan Note (Addendum)
 Continue to monitor closely. Various symptoms of aortic stenosis reviewed extensively. Discussed intervention indication of severe aortic stenosis.  Will follow-up with repeat echocardiogram tentatively in November 2025, last echocardiogram was February 2025.  Follow-up in the office after the echocardiogram tentatively in December 2025.

## 2023-08-20 NOTE — Assessment & Plan Note (Signed)
 Right and left heart cath 07-24-2023 with LVEDP 26 mmHg, mean PA pressure 34 mmHg, PCWP 22 mmHg, cardiac index 2.53.  Advised about dietary salt and fluid restriction to below 2 g/day and 2 L/day respectively. Will initiate furosemide 20 mg to be used every other day for the next 7 days after which she will use it on an as-needed basis for weight gain over 2 to 3 pounds in a day or 4 to 5 pounds in a week.  Advised to monitor daily weights at home.  Continue with losartan  100 mg once daily Will monitor his response and at subsequent visits depending on his progress we will consider adding SGLT2 inhibitors. If from diabetic standpoint his PCP is going to initiate SGLT2 inhibitors it would be beneficial.

## 2023-08-20 NOTE — Patient Instructions (Signed)
 Medication Instructions:  Start Lasix 20 mg- Take Every other day for 7-8 days After 7-8 days: Take Lasix 20 mg if you gain 2-3 pounds in one day or 5 pounds in one week *If you need a refill on your cardiac medications before your next appointment, please call your pharmacy*  Keep a log of daily weights  Eat a low sodium diet  Testing/Procedures: Your physician has requested that you have an Echocardiogram in November. Echocardiography is a painless test that uses sound waves to create images of your heart. It provides your doctor with information about the size and shape of your heart and how well your heart's chambers and valves are working. This procedure takes approximately one hour. There are no restrictions for this procedure. Please do NOT wear cologne, perfume, aftershave, or lotions (deodorant is allowed). Please arrive 15 minutes prior to your appointment time.  Please note: We ask at that you not bring children with you during ultrasound (echo/ vascular) testing. Due to room size and safety concerns, children are not allowed in the ultrasound rooms during exams. Our front office staff cannot provide observation of children in our lobby area while testing is being conducted. An adult accompanying a patient to their appointment will only be allowed in the ultrasound room at the discretion of the ultrasound technician under special circumstances. We apologize for any inconvenience.   Follow-Up: At [facility], you and your health needs are our priority.  As part of our continuing mission to provide you with exceptional heart care, our providers are all part of one team.  This team includes your primary Cardiologist (physician) and Advanced Practice Providers or APPs (Physician Assistants and Nurse Practitioners) who all work together to provide you with the care you need, when you need it.  Your next appointment:    Appointment in November/December after Echo  Provider:    Bertha Broad, MD    We recommend signing up for the patient portal called MyChart.  Sign up information is provided on this After Visit Summary.  MyChart is used to connect with patients for Virtual Visits (Telemedicine).  Patients are able to view lab/test results, encounter notes, upcoming appointments, etc.  Non-urgent messages can be sent to your provider as well.   To learn more about what you can do with MyChart, go to [email or web address].   Low-Sodium Eating Plan Salt (sodium) helps you keep a healthy balance of fluids in your body. Too much sodium can raise your blood pressure. It can also cause fluid and waste to be held in your body. Your health care provider or dietitian may recommend a low-sodium eating plan if you have high blood pressure (hypertension), kidney disease, liver disease, or heart failure. Eating less sodium can help lower your blood pressure and reduce swelling. It can also protect your heart, liver, and kidneys. What are tips for following this plan? Reading food labels  Check food labels for the amount of sodium per serving. If you eat more than one serving, you must multiply the listed amount by the number of servings. Choose foods with less than 140 milligrams (mg) of sodium per serving. Avoid foods with 300 mg of sodium or more per serving. Always check how much sodium is in a product, even if the label says unsalted or no salt added. Shopping  Buy products labeled as low-sodium or no salt added. Buy fresh foods. Avoid canned foods and pre-made or frozen meals. Avoid canned, cured, or processed meats. Buy breads that  have less than 80 mg of sodium per slice. Cooking  Eat more home-cooked food. Try to eat less restaurant, buffet, and fast food. Try not to add salt when you cook. Use salt-free seasonings or herbs instead of table salt or sea salt. Check with your provider or pharmacist before using salt substitutes. Cook with plant-based  oils, such as canola, sunflower, or olive oil. Meal planning When eating at a restaurant, ask if your food can be made with less salt or no salt. Avoid dishes labeled as brined, pickled, cured, or smoked. Avoid dishes made with soy sauce, miso, or teriyaki sauce. Avoid foods that have monosodium glutamate (MSG) in them. MSG may be added to some restaurant food, sauces, soups, bouillon, and canned foods. Make meals that can be grilled, baked, poached, roasted, or steamed. These are often made with less sodium. General information Try to limit your sodium intake to 1,500-2,300 mg each day, or the amount told by your provider. What foods should I eat? Fruits Fresh, frozen, or canned fruit. Fruit juice. Vegetables Fresh or frozen vegetables. No salt added canned vegetables. No salt added tomato sauce and paste. Low-sodium or reduced-sodium tomato and vegetable juice. Grains Low-sodium cereals, such as oats, puffed wheat and rice, and shredded wheat. Low-sodium crackers. Unsalted rice. Unsalted pasta. Low-sodium bread. Whole grain breads and whole grain pasta. Meats and other proteins Fresh or frozen meat, poultry, seafood, and fish. These should have no added salt. Low-sodium canned tuna and salmon. Unsalted nuts. Dried peas, beans, and lentils without added salt. Unsalted canned beans. Eggs. Unsalted nut butters. Dairy Milk. Soy milk. Cheese that is naturally low in sodium, such as ricotta cheese, fresh mozzarella, or Swiss cheese. Low-sodium or reduced-sodium cheese. Cream cheese. Yogurt. Seasonings and condiments Fresh and dried herbs and spices. Salt-free seasonings. Low-sodium mustard and ketchup. Sodium-free salad dressing. Sodium-free light mayonnaise. Fresh or refrigerated horseradish. Lemon juice. Vinegar. Other foods Homemade, reduced-sodium, or low-sodium soups. Unsalted popcorn and pretzels. Low-salt or salt-free chips. The items listed above may not be all the foods and drinks you  can have. Talk to a dietitian to learn more. What foods should I avoid? Vegetables Sauerkraut, pickled vegetables, and relishes. Olives. [place] fries. Onion rings. Regular canned vegetables, except low-sodium or reduced-sodium items. Regular canned tomato sauce and paste. Regular tomato and vegetable juice. Frozen vegetables in sauces. Grains Instant hot cereals. Bread stuffing, pancake, and biscuit mixes. Croutons. Seasoned rice or pasta mixes. Noodle soup cups. Boxed or frozen macaroni and cheese. Regular salted crackers. Self-rising flour. Meats and other proteins Meat or fish that is salted, canned, smoked, spiced, or pickled. Precooked or cured meat, such as sausages or meat loaves. Helene Loader. Ham. Pepperoni. Hot dogs. Corned beef. Chipped beef. Salt pork. Jerky. Pickled herring, anchovies, and sardines. Regular canned tuna. Salted nuts. Dairy Processed cheese and cheese spreads. Hard cheeses. Cheese curds. Blue cheese. Feta cheese. String cheese. Regular cottage cheese. Buttermilk. Canned milk. Fats and oils Salted butter. Regular margarine. Ghee. Bacon fat. Seasonings and condiments Onion salt, garlic salt, seasoned salt, table salt, and sea salt. Canned and packaged gravies. Worcestershire sauce. Tartar sauce. Barbecue sauce. Teriyaki sauce. Soy sauce, including reduced-sodium soy sauce. Steak sauce. Fish sauce. Oyster sauce. Cocktail sauce. Horseradish that you find on the shelf. Regular ketchup and mustard. Meat flavorings and tenderizers. Bouillon cubes. Hot sauce. Pre-made or packaged marinades. Pre-made or packaged taco seasonings. Relishes. Regular salad dressings. Salsa. Other foods Salted popcorn and pretzels. Corn chips and puffs. Potato and tortilla chips. Canned  or dried soups. Pizza. Frozen entrees and pot pies. The items listed above may not be all the foods and drinks you should avoid. Talk to a dietitian to learn more. This information is not intended to replace advice given to  you by your health care provider. Make sure you discuss any questions you have with your health care provider. Document Revised: 03/09/2022 Document Reviewed: 03/09/2022 Elsevier Patient Education  2024 [employer].

## 2023-08-20 NOTE — Assessment & Plan Note (Signed)
 Suboptimal control at this time continue to monitor at home closely. Target blood pressure below 130/80 mmHg. Continue amlodipine  10 mg once daily Continue losartan  100 mg once daily. Starting furosemide 20 mg as below, if remains uncontrolled we will start beta-blockers.

## 2023-08-20 NOTE — Assessment & Plan Note (Signed)
 Recent lipid panel from 07-24-2023 total cholesterol 190, HDL 42, LDL 114, triglycerides 195.  Transition to Crestor  40 mg once daily and Zetia  10 mg once daily after his cardiac catheterization Jul 24, 2023.  Tolerating well. Continue the same, target LDL below 55 mg/dL.

## 2023-08-31 IMAGING — DX DG CHEST 1V PORT
1 series · 1 of 1 positions shown · non-contrast
Comparison: January 12, 2020.

CLINICAL DATA: Shortness of breath, allergic reaction by report.

EXAM:
PORTABLE CHEST 1 VIEW

[chest ap]
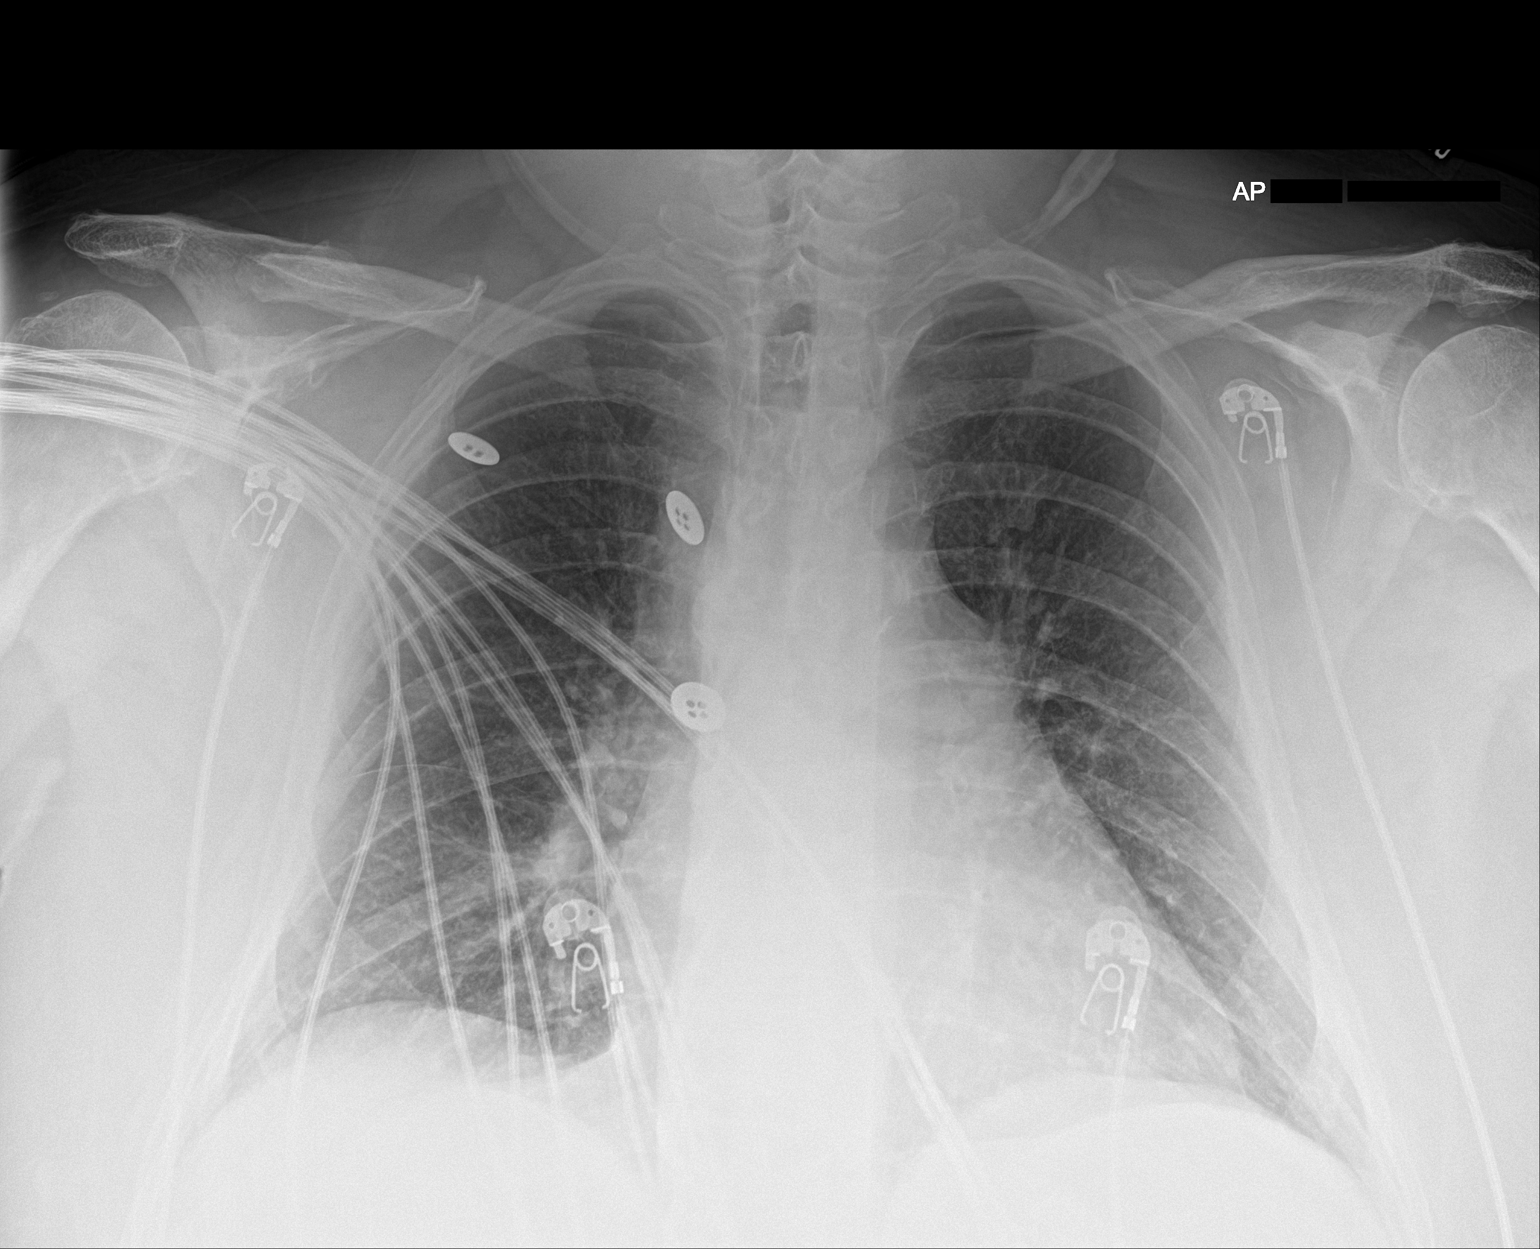

[1 of 1 positions shown; findings below may reference images not displayed]

FINDINGS: EKG leads project over the patient's chest. Cardiomediastinal
contours and hilar structures are stable compared to recent imaging.
Lungs are clear. No sign of effusion or edema on frontal radiograph.

On limited assessment there is no acute skeletal finding.
IMPRESSION: No acute cardiopulmonary findings.

## 2023-10-11 ENCOUNTER — Other Ambulatory Visit (HOSPITAL_COMMUNITY): Payer: Self-pay

## 2024-01-15 ENCOUNTER — Ambulatory Visit (HOSPITAL_BASED_OUTPATIENT_CLINIC_OR_DEPARTMENT_OTHER)
Admission: RE | Admit: 2024-01-15 | Discharge: 2024-01-15 | Disposition: A | Discharge status: Home / Self Care | Source: Ambulatory Visit

## 2024-01-15 DIAGNOSIS — I350 Nonrheumatic aortic (valve) stenosis: Secondary | ICD-10-CM | POA: Insufficient documentation

## 2024-01-15 LAB — ECHOCARDIOGRAM COMPLETE
AR max vel: 1.07 cm2
AV Area VTI: 1.28 cm2
AV Area mean vel: 1.08 cm2
AV Mean grad: 18.3 mmHg
AV Peak grad: 31.2 mmHg
Ao pk vel: 2.79 m/s
Area-P 1/2: 2.91 cm2
Calc EF: 65 %
MV M vel: 3.47 m/s
MV Peak grad: 48.2 mmHg
MV VTI: 1.77 cm2
S' Lateral: 2.4 cm
Single Plane A2C EF: 60.4 %
Single Plane A4C EF: 71.5 %

## 2024-01-23 ENCOUNTER — Telehealth: Payer: Self-pay

## 2024-01-23 NOTE — Telephone Encounter (Signed)
 Patient's wife was called back at this time, let her know the echo has been sent to Dr. Liborio to review, and as soon as it is reviewed, the office will call back with his results. Will send message to Dr. Liborio to make him aware of call.   Alan, RN

## 2024-01-23 NOTE — Telephone Encounter (Signed)
  Patient's wife calling to follow up the patient's echo result. She said if she unable to answer the call to leave a detailed message

## 2024-01-24 ENCOUNTER — Telehealth: Payer: Self-pay

## 2024-01-24 NOTE — Telephone Encounter (Signed)
 Echo Results reviewed with pt's spouse as per Dr. Madireddy's note.  Pt verbalized understanding and had no additional questions. Routed to PCP

## 2024-03-19 ENCOUNTER — Ambulatory Visit

## 2024-03-19 VITALS — BP 130/50 | HR 66 | Ht 64.0 in | Wt 217.2 lb

## 2024-03-19 DIAGNOSIS — E785 Hyperlipidemia, unspecified: Secondary | ICD-10-CM | POA: Insufficient documentation

## 2024-03-19 DIAGNOSIS — I5032 Chronic diastolic (congestive) heart failure: Secondary | ICD-10-CM | POA: Diagnosis present

## 2024-03-19 DIAGNOSIS — I25118 Atherosclerotic heart disease of native coronary artery with other forms of angina pectoris: Secondary | ICD-10-CM | POA: Diagnosis not present

## 2024-03-19 DIAGNOSIS — I10 Essential (primary) hypertension: Secondary | ICD-10-CM | POA: Diagnosis not present

## 2024-03-19 DIAGNOSIS — I350 Nonrheumatic aortic (valve) stenosis: Secondary | ICD-10-CM | POA: Insufficient documentation

## 2024-03-19 NOTE — Assessment & Plan Note (Signed)
 S/p cardiac cath 07/24/2023, single-vessel proximal RCA disease underwent PCI.  Doing well. Good functional capacity. Continue dual antiplatelet therapy for 12 months. After May 2026 he will drop aspirin  and continue Plavix  75 mg once daily.

## 2024-03-19 NOTE — Progress Notes (Signed)
 "  Cardiology Consultation:    Date:  03/19/2024   ID:  Ronald [name], DOB 09-22-1950, MRN [id number]  PCP:  Prescilla Golas, PA-C  Cardiologist:  Alean SAUNDERS Lane Kjos, MD   Referring MD: Prescilla Golas, PA-C   No chief complaint on file.    ASSESSMENT AND PLAN:   Mr Schimming 74 year old male with history of moderate aortic stenosis, hypertension, hyperlipidemia, diabetes mellitus type 2, lumbar spinal stenosis with chronic back pain, obesity, former smoker, high suspicion for sleep apnea, progressive symptoms of chest pain and shortness of breath with exertion now with cardiac cath identifying single obstructive coronary artery disease involving proximal RCA underwent PCI 07-24-2023 findings with right heart cath noted elevated LVEDP and PCWP with normal cardiac index.     Echocardiogram 01/15/2024 with EF 60 to 65%, grade 1 diastolic dysfunction, moderate aortic stenosis V-max 2.8 m/s, mean gradient 18 mmHg, valve area calculated 1.28 cm.  Mitral annular calcification noted with mean gradient 4 mmHg.  Here for follow-up visit.  Problem List Items Addressed This Visit       Cardiovascular and Mediastinum   Benign essential hypertension   Well-controlled. Continue amlodipine  10 mg once daily Losartan  100 mg once daily       Relevant Medications   EPINEPHrine 0.3 mg/0.3 mL IJ SOAJ injection   Moderate aortic stenosis - Primary   Echocardiogram from 01/15/2019 5 aortic valve V-max 2.8 m/s, mean gradient 18 mmHg, valve area 1.28 cm. Continue to monitor.  Discussed with and answered his questions with regards to aortic valve stenosis progression, options for valve replacement with surgery versus TAVR.  Options for prosthetic versus mechanical valve.        Relevant Medications   EPINEPHrine 0.3 mg/0.3 mL IJ SOAJ injection   CAD (coronary artery disease)   S/p cardiac cath 07/24/2023, single-vessel proximal RCA disease underwent PCI.  Doing well. Good functional  capacity. Continue dual antiplatelet therapy for 12 months. After May 2026 he will drop aspirin  and continue Plavix  75 mg once daily.       Relevant Medications   EPINEPHrine 0.3 mg/0.3 mL IJ SOAJ injection   Chronic diastolic heart failure (HCC)   Right and left heart cath 07-24-2023 with LVEDP 26 mmHg, mean PA pressure 34 mmHg, PCWP 22 mmHg, cardiac index 2.53.   Advised about dietary salt and fluid restriction to below 2 g/day and 2 L/day respectively.   Continue furosemide  20 mg on an as-needed basis for any significant weight gain 2 to 3 pounds in a day or 5 pounds in a week.  Continue losartan  100 mg once daily       Relevant Medications   EPINEPHrine 0.3 mg/0.3 mL IJ SOAJ injection     Other   Hyperlipidemia LDL goal <55   Last lipid panel is from May 2025. Mentions he has upcoming follow-up visit with his PCP for blood work. Continue Crestor  40 mg once daily and Zetia  10 mg once daily. Target LDL below 55 mg/dL. If lipid panel not checked with PCP will have levels rechecked levels rechecked in about 8 months at his next follow-up visit with us .      Relevant Medications   EPINEPHrine 0.3 mg/0.3 mL IJ SOAJ injection    Return to clinic tentatively in 8 to 9 months.  History of Present Illness:    Ronald [name] is a 74 y.o. male who is being seen today for follow-up visit. PCP is Bulla, Golas, PA-C. Last visit with me in the office  was 08/20/2023.  Here for the visit today accompanied by his wife.  Has history of moderate aortic stenosis, hypertension, hyperlipidemia, diabetes mellitus type 2, lumbar spinal stenosis with chronic back pain, obesity, former smoker, high suspicion for sleep apnea, progressive symptoms of chest pain and shortness of breath with exertion now with cardiac cath identifying single obstructive coronary artery disease involving proximal RCA underwent PCI 07-24-2023 findings with right heart cath noted elevated LVEDP and PCWP with normal  cardiac index.    Echocardiogram from 01/15/2024 notes EF 60 to 65%, grade 1 diastolic dysfunction, normal RV size and function, moderate aortic stenosis with V-max 2.8 m/s, mean gradient 18 mmHg, aortic valve area calculated 1.28 cm, mild increase in gradient across the mitral valve 4 mmHg.  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  Denies any symptoms of chest pain, shortness of breath, orthopnea or paroxysmal nocturnal dyspnea. No pedal edema No syncopal or near syncopal episodes.  He had questions with regards to valve management in the future with regards to the type of valve mechanical prosthetic and method of replacement.   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   Inactive since 06/28/07     Chronic diastolic heart failure (HCC) 08/20/2023   Chronic kidney disease    cyst on L kidney   Diabetes mellitus without complication (HCC)    Facet arthropathy, lumbar 08/10/2022   Hyperlipidemia LDL goal <55 03/16/2014   Hypertension    Impingement syndrome of left shoulder 09/27/2017   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   S/p PDT x 2 11/02, and 8/04 s/p avastin 01/20/05 inactive since 06/28/07     Moderate aortic stenosis 05/22/2023   Nuclear sclerotic cataract of right eye 05/20/2015   Obesity (BMI 35.0-39.9 without comorbidity) 07/24/2023   Primary osteoarthritis of left hip  03/16/2014   Pure hypercholesterolemia, unspecified 06/25/2015   S/P drug eluting coronary stent placement 08/01/2023   S/P lumbar laminectomy 01/14/2020   Spinal stenosis of lumbar region without neurogenic claudication 06/16/2019   Type 2 diabetes mellitus with hyperglycemia, with long-term current use of insulin  (HCC) 08/31/2021   Wax in ear 12/12/2022    Past Surgical History:  Procedure Laterality Date   CHOLECYSTECTOMY     CORONARY STENT INTERVENTION N/A 08/01/2023   Procedure: CORONARY STENT INTERVENTION;  Surgeon: Jordan, Peter M, MD;  Location: [facility] INVASIVE CV LAB;  Service: Cardiovascular;  Laterality: N/A;   EYE SURGERY     FRACTURE SURGERY     L arm   LUMBAR LAMINECTOMY/DECOMPRESSION MICRODISCECTOMY Left 01/14/2020   Procedure: Laminectomy - Lumbar three-Lumbar four - Lumbar four-Lumbar five - left;  Surgeon: Joshua Alm RAMAN, MD;  Location: [facility] OR;  Service: Neurosurgery;  Laterality: Left;   PROSTATE SURGERY     s/p TURP 03/13/18   RIGHT/LEFT HEART CATH AND CORONARY ANGIOGRAPHY N/A 08/01/2023   Procedure: RIGHT/LEFT HEART CATH AND CORONARY ANGIOGRAPHY;  Surgeon: Jordan, Peter M, MD;  Location: [facility] INVASIVE CV LAB;  Service: Cardiovascular;  Laterality: N/A;    Current Medications: Active Medications[1]   Allergies:   Kenalog [triamcinolone acetonide] and Lidocaine    Social History   Socioeconomic History   Marital status: Married    Spouse name: Not on file   Number of children: Not on file   Years of education: Not on file   Highest education level: Not on file  Occupational History   Not on file  Tobacco Use   Smoking status: Former   Smokeless tobacco: Former  [employer]vertising [employer]   Vaping status: Never Used  Substance and Sexual Activity   Alcohol use: Never   Drug use: Never   Sexual activity: Not on file  Other Topics Concern   Not on file  Social History Narrative   Not on file   Social Drivers of Health   Tobacco Use: Medium Risk (03/19/2024)   Patient  History    Smoking Tobacco Use: Former    Smokeless Tobacco Use: Former    Passive Exposure: Not on Actuary Strain: Patient Declined (10/16/2023)   Received from [facility]   Overall Financial Resource Strain (CARDIA)    How hard is it for you to pay for the very basics like food, housing, medical care, and heating?: Patient declined  Food Insecurity: No Food Insecurity (10/16/2023)   Received from [facility]   Epic    Within the past 12 months, you worried that your food would run out before you got the money to buy more.: Never true    Within the past 12 months, the food you bought just didn't last and you didn't have money to get more.: Never true  Transportation Needs: Patient Declined (10/16/2023)   Received from [facility]    In the past 12 months, has lack of transportation kept you from medical appointments or from getting medications?: Patient declined    In the past 12 months, has lack of transportation kept you from meetings, work, or from getting things needed for daily living?: Patient declined  Physical Activity: Not on file  Stress: Not on file  Social Connections: Not on file  Depression (EYV7-0): Not on file  Alcohol Screen: Not on file  Housing: Low Risk (01/10/2024)   Received from [facility]   Epic    What is your living situation today?: I have a steady place to live    Think about the place you live. Do you have problems with any of the following? Choose all that apply:: None/None on this list  Utilities: Patient Declined (10/16/2023)   Received from [facility]    In the past 12 months has the electric, gas, oil, or water company threatened to shut off services in your home?: Patient declined  Health Literacy: Not on file     Family History: The patient's family history includes Diabetes in his brother; Heart Problems in his mother; Hypertension in his mother. ROS:   Please see the history of present illness.    All  14 point review of systems negative except as described per history of present illness.  EKGs/Labs/Other Studies Reviewed:    The following studies were reviewed today:   EKG:       Recent Labs: 07/24/2023: BUN 22; Creatinine, Ser 1.25; NT-Pro BNP 148; Platelets 357 08/01/2023: Hemoglobin 12.2; Hemoglobin 12.2; Potassium 3.8; Potassium 3.9; Sodium 141; Sodium 142  Recent Lipid Panel    Component Value  Date/Time   CHOL 190 07/24/2023 1317   TRIG 195 (H) 07/24/2023 1317   HDL 42 07/24/2023 1317   CHOLHDL 4.5 07/24/2023 1317   LDLCALC 114 (H) 07/24/2023 1317    Physical Exam:    VS:  BP (!) 130/50   Pulse 66   Ht 5' 4 (1.626 m)   Wt 217 lb 4 oz (98.5 kg)   SpO2 96%   BMI 37.29 kg/m     Wt Readings from Last 3 Encounters:  03/19/24 217 lb 4 oz (98.5 kg)  08/20/23 234 lb (106.1 kg)  08/01/23 235 lb (106.6 kg)     GENERAL:  Well nourished, well developed in no acute distress NECK: No JVD; No carotid bruits CARDIAC: RRR, S1 and S2 present, 3/6 ejection systolic murmur best heard right upper sternal border. CHEST:  Clear to auscultation without rales, wheezing or rhonchi  Extremities: Trace bilateral pitting ankle edema. Pulses bilaterally symmetric with radial 2+ and dorsalis pedis 2+ NEUROLOGIC:  Alert and oriented x 3  Medication Adjustments/Labs and Tests Ordered: Current medicines are reviewed at length with the patient today.  Concerns regarding medicines are outlined above.  No orders of the defined types were placed in this encounter.  No orders of the defined types were placed in this encounter.   Signed, Alean reddy Brnadon Eoff, MD, MPH, [facility]. 03/19/2024 9:54 AM    [facility]    [1]  Current Meds  Medication Sig   amLODipine  (NORVASC ) 10 MG tablet Take 10 mg by mouth daily.   aspirin  EC 81 MG tablet Take 1 tablet (81 mg total) by mouth daily. Swallow whole.   clopidogrel  (PLAVIX ) 75 MG tablet Take 1 tablet (75 mg total) by mouth  daily with breakfast.   dicyclomine (BENTYL) 20 MG tablet Take 20 mg by mouth as needed.   EPINEPHrine 0.3 mg/0.3 mL IJ SOAJ injection Inject 0.3 mg into the muscle as needed.   ezetimibe  (ZETIA ) 10 MG tablet Take 1 tablet (10 mg total) by mouth daily.   furosemide  (LASIX ) 20 MG tablet Take 20 mg every other day for 8 days. After 8 days, take 20 mg daily as needed for a weight gain of 2-3 pounds overnight or 5 pounds in one week.   glucose blood (FREESTYLE LITE) test strip 1 each by Other route as directed.   hydrOXYzine (ATARAX) 25 MG tablet Take 25 mg by mouth as needed.   Insulin  Glargine (BASAGLAR  KWIKPEN) 100 UNIT/ML Inject 30 Units into the skin daily. Adjust as directed   insulin  regular (NOVOLIN R) 100 units/mL injection Inject 4-12 Units into the skin 2 (two) times daily as needed for high blood sugar.   losartan  (COZAAR ) 100 MG tablet Take 100 mg by mouth daily.   meloxicam (MOBIC) 15 MG tablet Take 15 mg by mouth as needed.   metFORMIN  (GLUCOPHAGE ) 500 MG tablet Take 500 mg by mouth daily at 6 (six) AM.   MOUNJARO 5 MG/0.5ML Pen Inject 5 mg into the skin once a week.   nitroGLYCERIN  (NITROSTAT ) 0.4 MG SL tablet Place 1 tablet (0.4 mg total) under the tongue every 5 (five) minutes as needed for chest pain.   rosuvastatin  (CRESTOR ) 40 MG tablet Take 1 tablet (40 mg total) by mouth daily.   traMADol (ULTRAM) 50 MG tablet Take 50 mg by mouth every 6 (six) hours as needed for moderate pain (pain score 4-6).   triamcinolone cream (KENALOG) 0.1 % Apply 1 Application topically as needed.  VITAMIN D PO Take 1 capsule by mouth daily.   "

## 2024-03-19 NOTE — Assessment & Plan Note (Signed)
 Echocardiogram from 01/15/2019 5 aortic valve V-max 2.8 m/s, mean gradient 18 mmHg, valve area 1.28 cm. Continue to monitor.  Discussed with and answered his questions with regards to aortic valve stenosis progression, options for valve replacement with surgery versus TAVR.  Options for prosthetic versus mechanical valve.

## 2024-03-19 NOTE — Patient Instructions (Signed)
 Medication Instructions:  Your physician recommends that you continue on your current medications as directed. Please refer to the Current Medication list given to you today.  *If you need a refill on your cardiac medications before your next appointment, please call your pharmacy*  Lab Work: None If you have labs (blood work) drawn today and your tests are completely normal, you will receive your results only by: MyChart Message (if you have MyChart) OR A paper copy in the mail If you have any lab test that is abnormal or we need to change your treatment, we will call you to review the results.  Testing/Procedures: None  Follow-Up: At [facility], you and your health needs are our priority.  As part of our continuing mission to provide you with exceptional heart care, our providers are all part of one team.  This team includes your primary Cardiologist (physician) and Advanced Practice Providers or APPs (Physician Assistants and Nurse Practitioners) who all work together to provide you with the care you need, when you need it.  Your next appointment:   9 month(s)  Provider:   Alean Kobus, MD    We recommend signing up for the patient portal called MyChart.  Sign up information is provided on this After Visit Summary.  MyChart is used to connect with patients for Virtual Visits (Telemedicine).  Patients are able to view lab/test results, encounter notes, upcoming appointments, etc.  Non-urgent messages can be sent to your provider as well.   To learn more about what you can do with MyChart, go to [email or web address].   Other Instructions None

## 2024-03-19 NOTE — Assessment & Plan Note (Signed)
 Well-controlled. Continue amlodipine  10 mg once daily Losartan  100 mg once daily

## 2024-03-19 NOTE — Assessment & Plan Note (Addendum)
 Last lipid panel is from May 2025. Mentions he has upcoming follow-up visit with his PCP for blood work. Continue Crestor  40 mg once daily and Zetia  10 mg once daily. Target LDL below 55 mg/dL. If lipid panel not checked with PCP will have levels rechecked levels rechecked in about 8 months at his next follow-up visit with us .

## 2024-03-19 NOTE — Assessment & Plan Note (Signed)
 Right and left heart cath 07-24-2023 with LVEDP 26 mmHg, mean PA pressure 34 mmHg, PCWP 22 mmHg, cardiac index 2.53.   Advised about dietary salt and fluid restriction to below 2 g/day and 2 L/day respectively.   Continue furosemide  20 mg on an as-needed basis for any significant weight gain 2 to 3 pounds in a day or 5 pounds in a week.  Continue losartan  100 mg once daily
